# Patient Record
Sex: Male | Born: 1965 | Race: White | Hispanic: No | Smoking: Heavy tobacco smoker
Health system: Southern US, Community
[De-identification: ages and names within clinical notes are randomized; demographics above are authoritative.]

## PROBLEM LIST (undated history)

## (undated) DIAGNOSIS — F319 Bipolar disorder, unspecified: Secondary | ICD-10-CM

## (undated) DIAGNOSIS — F32A Depression, unspecified: Secondary | ICD-10-CM

## (undated) DIAGNOSIS — F329 Major depressive disorder, single episode, unspecified: Secondary | ICD-10-CM

## (undated) DIAGNOSIS — I10 Essential (primary) hypertension: Secondary | ICD-10-CM

## (undated) DIAGNOSIS — I639 Cerebral infarction, unspecified: Secondary | ICD-10-CM

## (undated) DIAGNOSIS — F419 Anxiety disorder, unspecified: Secondary | ICD-10-CM

## (undated) HISTORY — PX: WISDOM TOOTH EXTRACTION: SHX21

## (undated) HISTORY — PX: LOOP RECORDER INSERTION: EP1214

---
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
## 2019-02-20 ENCOUNTER — Inpatient Hospital Stay (HOSPITAL_COMMUNITY): Payer: BC Managed Care – PPO

## 2019-02-20 ENCOUNTER — Emergency Department (HOSPITAL_COMMUNITY): Payer: BC Managed Care – PPO

## 2019-02-20 ENCOUNTER — Inpatient Hospital Stay (HOSPITAL_COMMUNITY)
Admission: EM | Admit: 2019-02-20 | Discharge: 2019-02-22 | DRG: 061 | Disposition: A | Discharge status: Home / Self Care | Payer: BC Managed Care – PPO | Attending: Neurology | Admitting: Neurology

## 2019-02-20 ENCOUNTER — Other Ambulatory Visit: Payer: Self-pay

## 2019-02-20 ENCOUNTER — Encounter (HOSPITAL_COMMUNITY): Payer: Self-pay | Admitting: Radiology

## 2019-02-20 DIAGNOSIS — I63519 Cerebral infarction due to unspecified occlusion or stenosis of unspecified middle cerebral artery: Secondary | ICD-10-CM | POA: Diagnosis present

## 2019-02-20 DIAGNOSIS — I160 Hypertensive urgency: Secondary | ICD-10-CM | POA: Diagnosis present

## 2019-02-20 DIAGNOSIS — R531 Weakness: Secondary | ICD-10-CM | POA: Diagnosis present

## 2019-02-20 DIAGNOSIS — R29704 NIHSS score 4: Secondary | ICD-10-CM | POA: Diagnosis present

## 2019-02-20 DIAGNOSIS — E785 Hyperlipidemia, unspecified: Secondary | ICD-10-CM | POA: Diagnosis present

## 2019-02-20 DIAGNOSIS — F1020 Alcohol dependence, uncomplicated: Secondary | ICD-10-CM | POA: Diagnosis present

## 2019-02-20 DIAGNOSIS — Z7289 Other problems related to lifestyle: Secondary | ICD-10-CM

## 2019-02-20 DIAGNOSIS — Q279 Congenital malformation of peripheral vascular system, unspecified: Secondary | ICD-10-CM

## 2019-02-20 DIAGNOSIS — G8190 Hemiplegia, unspecified affecting unspecified side: Secondary | ICD-10-CM | POA: Diagnosis present

## 2019-02-20 DIAGNOSIS — E663 Overweight: Secondary | ICD-10-CM | POA: Diagnosis present

## 2019-02-20 DIAGNOSIS — F17210 Nicotine dependence, cigarettes, uncomplicated: Secondary | ICD-10-CM | POA: Diagnosis present

## 2019-02-20 DIAGNOSIS — I639 Cerebral infarction, unspecified: Secondary | ICD-10-CM | POA: Diagnosis not present

## 2019-02-20 DIAGNOSIS — F1010 Alcohol abuse, uncomplicated: Secondary | ICD-10-CM | POA: Diagnosis present

## 2019-02-20 DIAGNOSIS — I6529 Occlusion and stenosis of unspecified carotid artery: Secondary | ICD-10-CM | POA: Diagnosis present

## 2019-02-20 DIAGNOSIS — I671 Cerebral aneurysm, nonruptured: Secondary | ICD-10-CM | POA: Diagnosis not present

## 2019-02-20 DIAGNOSIS — Q282 Arteriovenous malformation of cerebral vessels: Secondary | ICD-10-CM

## 2019-02-20 DIAGNOSIS — Z20822 Contact with and (suspected) exposure to covid-19: Secondary | ICD-10-CM | POA: Diagnosis present

## 2019-02-20 DIAGNOSIS — I6389 Other cerebral infarction: Secondary | ICD-10-CM | POA: Diagnosis not present

## 2019-02-20 HISTORY — DX: Essential (primary) hypertension: I10

## 2019-02-20 LAB — DIFFERENTIAL
Abs Immature Granulocytes: 0.06 10*3/uL (ref 0.00–0.07)
Basophils Absolute: 0.1 10*3/uL (ref 0.0–0.1)
Basophils Relative: 1 %
Eosinophils Absolute: 0.7 10*3/uL — ABNORMAL HIGH (ref 0.0–0.5)
Eosinophils Relative: 5 %
Immature Granulocytes: 1 %
Lymphocytes Relative: 25 %
Lymphs Abs: 3.1 10*3/uL (ref 0.7–4.0)
Monocytes Absolute: 1 10*3/uL (ref 0.1–1.0)
Monocytes Relative: 8 %
Neutro Abs: 7.7 10*3/uL (ref 1.7–7.7)
Neutrophils Relative %: 60 %

## 2019-02-20 LAB — COMPREHENSIVE METABOLIC PANEL
ALT: 40 U/L (ref 0–44)
AST: 28 U/L (ref 15–41)
Albumin: 3.6 g/dL (ref 3.5–5.0)
Alkaline Phosphatase: 99 U/L (ref 38–126)
Anion gap: 9 (ref 5–15)
BUN: 9 mg/dL (ref 6–20)
CO2: 22 mmol/L (ref 22–32)
Calcium: 8.8 mg/dL — ABNORMAL LOW (ref 8.9–10.3)
Chloride: 103 mmol/L (ref 98–111)
Creatinine, Ser: 1.03 mg/dL (ref 0.61–1.24)
GFR calc Af Amer: >60 mL/min (ref >60)
GFR calc non Af Amer: >60 mL/min (ref >60)
Glucose, Bld: 88 mg/dL (ref 70–99)
Potassium: 4.2 mmol/L (ref 3.5–5.1)
Sodium: 134 mmol/L — ABNORMAL LOW (ref 135–145)
Total Bilirubin: 0.9 mg/dL (ref 0.3–1.2)
Total Protein: 6.8 g/dL (ref 6.5–8.1)

## 2019-02-20 LAB — I-STAT CHEM 8, ED
BUN: 11 mg/dL (ref 6–20)
Calcium, Ion: 1 mmol/L — ABNORMAL LOW (ref 1.15–1.40)
Chloride: 101 mmol/L (ref 98–111)
Creatinine, Ser: 1.1 mg/dL (ref 0.61–1.24)
Glucose, Bld: 85 mg/dL (ref 70–99)
HCT: 50 % (ref 39.0–52.0)
Hemoglobin: 17 g/dL (ref 13.0–17.0)
Potassium: 4.1 mmol/L (ref 3.5–5.1)
Sodium: 134 mmol/L — ABNORMAL LOW (ref 135–145)
TCO2: 24 mmol/L (ref 22–32)

## 2019-02-20 LAB — CBC
HCT: 50.2 % (ref 39.0–52.0)
Hemoglobin: 16.6 g/dL (ref 13.0–17.0)
MCH: 31.3 pg (ref 26.0–34.0)
MCHC: 33.1 g/dL (ref 30.0–36.0)
MCV: 94.7 fL (ref 80.0–100.0)
Platelets: 282 10*3/uL (ref 150–400)
RBC: 5.3 MIL/uL (ref 4.22–5.81)
RDW: 12.1 % (ref 11.5–15.5)
WBC: 12.5 10*3/uL — ABNORMAL HIGH (ref 4.0–10.5)
nRBC: 0 % (ref 0.0–0.2)

## 2019-02-20 LAB — MRSA PCR SCREENING: MRSA by PCR: NEGATIVE

## 2019-02-20 LAB — PROTIME-INR
INR: 0.9 (ref 0.8–1.2)
Prothrombin Time: 12.5 seconds (ref 11.4–15.2)

## 2019-02-20 LAB — CBG MONITORING, ED: Glucose-Capillary: 82 mg/dL (ref 70–99)

## 2019-02-20 LAB — APTT: aPTT: 29 seconds (ref 24–36)

## 2019-02-20 LAB — HIV ANTIBODY (ROUTINE TESTING W REFLEX): HIV Screen 4th Generation wRfx: NONREACTIVE

## 2019-02-20 LAB — ETHANOL: Alcohol, Ethyl (B): 40 mg/dL — ABNORMAL HIGH (ref <10)

## 2019-02-20 LAB — SARS CORONAVIRUS 2 (TAT 6-24 HRS): SARS Coronavirus 2: NEGATIVE

## 2019-02-20 MED ORDER — SODIUM CHLORIDE 0.9 % IV SOLN: INTRAVENOUS | Status: DC

## 2019-02-20 MED ORDER — ACETAMINOPHEN 325 MG PO TABS: 650.0000 mg | ORAL_TABLET | ORAL | Status: DC | PRN

## 2019-02-20 MED ORDER — ACETAMINOPHEN 650 MG RE SUPP: 650.0000 mg | RECTAL | Status: DC | PRN

## 2019-02-20 MED ORDER — STROKE: EARLY STAGES OF RECOVERY BOOK
Freq: Once | Status: DC
  Filled 2019-02-20: qty 1

## 2019-02-20 MED ORDER — PANTOPRAZOLE SODIUM 40 MG IV SOLR
40.0000 mg | Freq: Every day | INTRAVENOUS | Status: DC
  Administered 2019-02-21: 21:00:00 40 mg via INTRAVENOUS
  Filled 2019-02-20: qty 40

## 2019-02-20 MED ORDER — LABETALOL HCL 5 MG/ML IV SOLN: 20.0000 mg | Freq: Once | INTRAVENOUS | Status: DC

## 2019-02-20 MED ORDER — ACETAMINOPHEN 160 MG/5ML PO SOLN: 650.0000 mg | ORAL | Status: DC | PRN

## 2019-02-20 MED ORDER — SENNOSIDES-DOCUSATE SODIUM 8.6-50 MG PO TABS: 1.0000 | ORAL_TABLET | Freq: Every evening | ORAL | Status: DC | PRN

## 2019-02-20 MED ORDER — CLEVIDIPINE BUTYRATE 0.5 MG/ML IV EMUL: 0.0000 mg/h | INTRAVENOUS | Status: DC

## 2019-02-20 MED ORDER — ALTEPLASE (STROKE) FULL DOSE INFUSION
90.0000 mg | Freq: Once | INTRAVENOUS | Status: AC
  Administered 2019-02-20: 90 mg via INTRAVENOUS
  Filled 2019-02-20: qty 100

## 2019-02-20 MED ORDER — IOHEXOL 350 MG/ML SOLN
100.0000 mL | Freq: Once | INTRAVENOUS | Status: AC | PRN
  Administered 2019-02-20: 100 mL via INTRAVENOUS

## 2019-02-20 MED ORDER — SODIUM CHLORIDE 0.9% FLUSH
3.0000 mL | Freq: Once | INTRAVENOUS | Status: AC
  Administered 2019-02-20: 3 mL via INTRAVENOUS

## 2019-02-20 MED ORDER — SODIUM CHLORIDE 0.9 % IV SOLN
50.0000 mL | Freq: Once | INTRAVENOUS | Status: AC
  Administered 2019-02-20: 50 mL via INTRAVENOUS

## 2019-02-20 MED ORDER — CHLORHEXIDINE GLUCONATE CLOTH 2 % EX PADS
6.0000 | MEDICATED_PAD | Freq: Every day | CUTANEOUS | Status: DC
  Administered 2019-02-20: 6 via TOPICAL

## 2019-02-20 NOTE — Progress Notes (Signed)
PHARMACIST CODE STROKE RESPONSE  Notified to mix tPA at 1609 by Dr. Wilford Corner Delivered tPA to RN at 1612  tPA dose = 9mg  bolus over 1 minute followed by 81mg  for a total dose of 90mg  over 1 hour  Issues/delays encountered (if applicable):   PharmD. BCPS 02/20/19 4:18 PM

## 2019-02-20 NOTE — ED Notes (Signed)
[id number] Stephen Terry WIFE PLEASE UPDATE HER

## 2019-02-20 NOTE — H&P (Signed)
STROKE H&P  CC: left sided weakness,   History is obtained from:  HPI: Stephen Terry is a 54 y.o. male no significant past medical history brought in for sudden onset of left-sided facial droop and slurred speech. According the patient he was sitting watching TV, drinking beer.  He drinks about 12 beers a day and he had only had 4 beers today.  He noted sudden onset of left-sided weakness, left facial droop and his speech sounded much slurred than what he would expect for after having had 4 beers. EMS was called, they also noted left-sided facial droop that was predominant, some left drift and brought him as code stroke.  LKW: 1445 today tpa given?:  Yes Premorbid modified Rankin scale (mRS): 0  ROS: ROS was performed and is negative except as noted in the HPI\  History reviewed. No pertinent past medical history. No past history  No family history on file. No family history  Social History:   has no history on file for tobacco, alcohol, and drug. Denies Medications  Current Facility-Administered Medications:  .   stroke: mapping our early stages of recovery book, , Does not apply, Once, Milon Dikes, MD .  0.9 %  sodium chloride infusion, , Intravenous, Continuous, Milon Dikes, MD .  acetaminophen (TYLENOL) tablet 650 mg, 650 mg, Oral, Q4H PRN **OR** acetaminophen (TYLENOL) 160 MG/5ML solution 650 mg, 650 mg, Per Tube, Q4H PRN **OR** acetaminophen (TYLENOL) suppository 650 mg, 650 mg, Rectal, Q4H PRN, Milon Dikes, MD .  labetalol (NORMODYNE) injection 20 mg, 20 mg, Intravenous, Once **AND** clevidipine (CLEVIPREX) infusion 0.5 mg/mL, 0-21 mg/hr, Intravenous, Continuous, Milon Dikes, MD .  iohexol (OMNIPAQUE) 350 MG/ML injection 100 mL, 100 mL, Intravenous, Once PRN, Milon Dikes, MD .  pantoprazole (PROTONIX) injection 40 mg, 40 mg, Intravenous, QHS, Milon Dikes, MD .  senna-docusate (Senokot-S) tablet 1 tablet, 1 tablet, Oral, QHS PRN, Milon Dikes, MD .  sodium chloride  flush (NS) 0.9 % injection 3 mL, 3 mL, Intravenous, Once, Melene Plan, DO No current outpatient medications on file.  Exam: Current vital signs: BP (!) 145/88 (BP Location: Right Arm)   Pulse 68   Temp 98.4 F (36.9 C) (Oral)   Resp 12   Ht 6\' 3"  (1.905 m)   Wt 107 kg   SpO2 100%   BMI 29.48 kg/m  Vital signs in last 24 hours: Temp:  [98.4 F (36.9 C)] 98.4 F (36.9 C) (01/31 1655) Pulse Rate:  [68] 68 (01/31 1655) Resp:  [12] 12 (01/31 1655) BP: (145)/(88) 145/88 (01/31 1655) SpO2:  [100 %] 100 % (01/31 1655) Weight:  [107 kg-107.2 kg] 107 kg (01/31 1656) General: Awake alert in no distress HEENT: Cephalic atraumatic Lungs: Clear to auscultation Cardiovascular: Regular rate rhythm Abdomen: Soft nondistended nontender Extremities warm well perfused Neurological exam Awake alert oriented x3 Speech is dysarthric There is no evidence of aphasia Follows all commands Cranial: Pupils equal round react light, extraocular movements intact, visual fields are full, left lower facial weakness seen, tongue and palate midline. Motor exam mild left and left upper and lower extremity.  Right side full-strength Sensory exam: No loss Coordination: Mild dysmetria on the left not disproportionate to weakness but has difficulty performing rapid alternating movements in fine motor tasks from the left upper extremity. Gait testing was deferred NIH stroke scale 1a Level of Conscious.: 0 1b LOC Questions: 0 1c LOC Commands: 0 2 Best Gaze: 0 3 Visual: 0 4 Facial Palsy: 1 5a Motor Arm - left:  1 5b Motor Arm - Right: 0 6a Motor Leg - Left: 1 6b Motor Leg - Right: 0 7 Limb Ataxia: 0 8 Sensory: 0 9 Best Language: 0 10 Dysarthria: 1 11 Extinct. and Inatten.: 0 TOTAL: 4   Labs I have reviewed labs in epic and the results pertinent to this consultation are:  CBC    Component Value Date/Time   WBC 12.5 (H) 02/20/2019 1612   RBC 5.30 02/20/2019 1612   HGB 16.6 02/20/2019 1612   HGB  17.0 02/20/2019 1612   HCT 50.2 02/20/2019 1612   HCT 50.0 02/20/2019 1612   PLT 282 02/20/2019 1612   MCV 94.7 02/20/2019 1612   MCH 31.3 02/20/2019 1612   MCHC 33.1 02/20/2019 1612   RDW 12.1 02/20/2019 1612   LYMPHSABS 3.1 02/20/2019 1612   MONOABS 1.0 02/20/2019 1612   EOSABS 0.7 (H) 02/20/2019 1612   BASOSABS 0.1 02/20/2019 1612    CMP     Component Value Date/Time   NA 134 (L) 02/20/2019 1612   NA 134 (L) 02/20/2019 1612   K 4.2 02/20/2019 1612   K 4.1 02/20/2019 1612   CL 103 02/20/2019 1612   CL 101 02/20/2019 1612   CO2 22 02/20/2019 1612   GLUCOSE 88 02/20/2019 1612   GLUCOSE 85 02/20/2019 1612   BUN 9 02/20/2019 1612   BUN 11 02/20/2019 1612   CREATININE 1.03 02/20/2019 1612   CREATININE 1.10 02/20/2019 1612   CALCIUM 8.8 (L) 02/20/2019 1612   PROT 6.8 02/20/2019 1612   ALBUMIN 3.6 02/20/2019 1612   AST 28 02/20/2019 1612   ALT 40 02/20/2019 1612   ALKPHOS 99 02/20/2019 1612   BILITOT 0.9 02/20/2019 1612   GFRNONAA >60 02/20/2019 1612   GFRAA >60 02/20/2019 1612   Imaging I have reviewed the images obtained:  CT-scan of the brain-no acute changes. No bleed noted on my review Radiology read concerning for possible hyperdensity in the right lateral tentorium possibly a small meningioma but cannot rule out acute bleed. CT perfusion-negative CT angio head and neck-no emergent LVO There is an abnormal venous vascularity in the posterior right hemisphere which accounts for the increased right lateral density on the plain CT.  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  Neuro endovascular recommendation for follow-up.  Assessment: 54 year old man with history of alcoholism coming in with sudden onset of left-sided facial droop, dysarthria and left arm and leg weakness. Contraindications for TPA reviewed. I personally reviewed CT of the head and did not appreciate a frank bleed.  I  offered him TPA and discussed risk benefits with after discussing risks and benefits with him, he agreed for IV TPA which I started. After starting the IV TPA and him getting the bolus, the radiologist called with the CT finding of concern-possible meningioma versus a bleed in the right tentorial area. I stopped the TPA, till further imaging in the form of CTA head and neck could be obtained. The CT head and neck revealed a possibly vascular malformation in that area and not a frank bleed.  tPA was restarted.  Impression: Acute ischemic stroke-likely small vessel etiology EtOH abuse-could be a confounder. 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  Needs endovascular consult Hypertensive urgency-did not require medications to bring down to levels for TPA administration.  Recommendations: Admit to ICU for post TPA care Post TPA vitals and neurochecks Systolic blood pressure goal less than 180-use  labetalol as needed and Cleviprex drip if needed. Frequent neurochecks No antiplatelets or anticoagulants MRI brain in 24 hours Image sooner if neurological changes observed 2D echo A1c Lipid panel PT OT Speech therapy IV fluids-normal saline 75 cc an hour Check morning labs Replete electrolytes as necessary Check urine toxicology and blood alcohol. CIWA protocol for alcohol abuse Endovascular consult for vascular malformation-for possible elective diagnostic cerebral angiogram.  -- Milon Dikes, MD [facility] Pager: [phone] If 7pm to 7am, please call on call as listed on AMION.  Present on arrival: Acute ischemic stroke, hemiparesis, hypertensive urgency.  CRITICAL CARE ATTESTATION Performed by: Milon Dikes, MD Total critical care time:40  minutes Critical care time was exclusive of separately billable procedures and treating other patients and/or supervising APPs/Residents/Students Critical care was necessary to treat or prevent imminent or  life-threatening deterioration due to acute ischemic stroke This patient is critically ill and at significant risk for neurological worsening and/or death and care requires constant monitoring. Critical care was time spent personally by me on the following activities: development of treatment plan with patient and/or surrogate as well as nursing, discussions with consultants, evaluation of patient's response to treatment, examination of patient, obtaining history from patient or surrogate, ordering and performing treatments and interventions, ordering and review of laboratory studies, ordering and review of radiographic studies, pulse oximetry, re-evaluation of patient's condition, participation in multidisciplinary rounds and medical decision making of high complexity in the care of this patient.

## 2019-02-20 NOTE — ED Provider Notes (Signed)
[place] EMERGENCY DEPARTMENT Provider Note   CSN: [id number] Arrival date & time: 02/20/19  1600  An emergency department physician performed an initial assessment on this suspected stroke patient at 1604.  History Chief Complaint  Patient presents with  . Code Stroke    Isahi Godwin is a 54 y.o. male.  54 year old male with a chief complaints of slurred speech and left-sided weakness.  Arrived as a code stroke.  Airway was protected at the bridge.  Taken urgently back to CAT scan.  Level 5 caveat acuity of condition.  The history is provided by the patient.  Illness Severity:  Severe Onset quality:  Sudden Duration:  1 hour Timing:  Constant Progression:  Unchanged Chronicity:  New Associated symptoms: no abdominal pain, no chest pain, no congestion, no diarrhea, no fever, no headaches, no myalgias, no rash, no shortness of breath and no vomiting        Past Medical History:  Diagnosis Date  . Hypertension     Patient Active Problem List   Diagnosis Date Noted  . Acute ischemic stroke ([place]) 02/20/2019    History reviewed. No pertinent surgical history.     No family history on file.  Social History   Tobacco Use  . Smoking status: Heavy Tobacco Smoker    Packs/day: 1.00    Types: Cigarettes  . Smokeless tobacco: Never Used  Substance Use Topics  . Alcohol use: Yes    Alcohol/week: 10.0 - 12.0 standard drinks    Types: 10 - 12 Cans of beer per week  . Drug use: Not on file    Home Medications Prior to Admission medications   Medication Sig Start Date End Date Taking? Authorizing Provider  lamoTRIgine (LAMICTAL) 200 MG tablet Take 200 mg by mouth daily.   Yes [provider]  propranolol (INDERAL) 20 MG tablet Take 20-40 mg by mouth 3 (three) times daily as needed (For tremers).   Yes [provider]  sertraline (ZOLOFT) 100 MG tablet Take 150 mg by mouth daily.   Yes [provider]    Allergies      Patient has no known allergies.  Review of Systems   Review of Systems  Constitutional: Negative for chills and fever.  HENT: Negative for congestion and facial swelling.   Eyes: Negative for discharge and visual disturbance.  Respiratory: Negative for shortness of breath.   Cardiovascular: Negative for chest pain and palpitations.  Gastrointestinal: Negative for abdominal pain, diarrhea and vomiting.  Musculoskeletal: Negative for arthralgias and myalgias.  Skin: Negative for color change and rash.  Neurological: Positive for speech difficulty and weakness. Negative for tremors, syncope and headaches.  Psychiatric/Behavioral: Negative for confusion and dysphoric mood.    Physical Exam Updated Vital Signs BP 134/79   Pulse 60   Temp 98.4 F (36.9 C) (Oral)   Resp (!) 21   Ht 6\' 3"  (1.905 m)   Wt 107 kg   SpO2 100%   BMI 29.48 kg/m   Physical Exam Vitals and nursing note reviewed.  Constitutional:      Appearance: He is well-developed.  HENT:     Head: Normocephalic and atraumatic.  Eyes:     Pupils: Pupils are equal, round, and reactive to light.  Neck:     Vascular: No JVD.  Cardiovascular:     Rate and Rhythm: Normal rate and regular rhythm.     Heart sounds: No murmur. No friction rub. No gallop.   Pulmonary:  Effort: No respiratory distress.     Breath sounds: No wheezing.  Abdominal:     General: There is no distension.     Tenderness: There is no guarding or rebound.  Musculoskeletal:        General: Normal range of motion.     Cervical back: Normal range of motion and neck supple.  Skin:    Coloration: Skin is not pale.     Findings: No rash.  Neurological:     Mental Status: He is alert and oriented to person, place, and time.     Comments: Left-sided facial droop left-sided weakness.  Psychiatric:        Behavior: Behavior normal.     ED Results / Procedures / Treatments   Labs (all labs ordered are listed, but only abnormal results are  displayed) Labs Reviewed  CBC - Abnormal; Notable for the following components:      Result Value   WBC 12.5 (*)    All other components within normal limits  DIFFERENTIAL - Abnormal; Notable for the following components:   Eosinophils Absolute 0.7 (*)    All other components within normal limits  COMPREHENSIVE METABOLIC PANEL - Abnormal; Notable for the following components:   Sodium 134 (*)    Calcium 8.8 (*)    All other components within normal limits  I-STAT CHEM 8, ED - Abnormal; Notable for the following components:   Sodium 134 (*)    Calcium, Ion 1.00 (*)    All other components within normal limits  SARS CORONAVIRUS 2 (TAT 6-24 HRS)  PROTIME-INR  APTT  HIV ANTIBODY (ROUTINE TESTING W REFLEX)  HEMOGLOBIN A1C  LIPID PANEL  ETHANOL  CBG MONITORING, ED    EKG None  Radiology CT ANGIO HEAD W OR WO CONTRAST  Result Date: 02/20/2019 CLINICAL DATA:  54 year old male code stroke presentation with left side weakness. IV tPA given. 8 mm nodular hyperdensity along the right tentorium on plain CT, favored to be small meningioma. EXAM: CT ANGIOGRAPHY HEAD AND NECK CT PERFUSION BRAIN TECHNIQUE: Multidetector CT imaging of the head and neck was performed using the standard protocol during bolus administration of intravenous contrast. Multiplanar CT image reconstructions and MIPs were obtained to evaluate the vascular anatomy. Carotid stenosis measurements (when applicable) are obtained utilizing NASCET criteria, using the distal internal carotid diameter as the denominator. Multiphase CT imaging of the brain was performed following IV bolus contrast injection. Subsequent parametric perfusion maps were calculated using RAPID software. CONTRAST:  100 milliliters Omnipaque 350 COMPARISON:  Head CT without contrast 1609 hours today. FINDINGS: CT Brain Perfusion Findings: ASPECTS: 10 CBF (<30%) Volume: None Perfusion (Tmax>6.0s) volume: None Mismatch Volume: Not applicable Infarction  Location:Not applicable CTA NECK Skeleton: Chronic sinusitis. No acute osseous abnormality identified. Upper chest: Negative. No superior mediastinal lymphadenopathy. Other neck: Negative neck soft tissues. Aortic arch: 3 vessel arch configuration with no arch atherosclerosis. Right carotid system: No brachiocephalic or right CCA origin plaque or stenosis. Negative right carotid bifurcation. Negative cervical right ICA. Left carotid system: Negative. Vertebral arteries: Both proximal subclavian arteries and cervical vertebral arteries are negative. The left vertebral is mildly dominant. CTA HEAD Posterior circulation: Mildly dominant left vertebral artery, the right V4 segment decreases in size beyond the PICA. Normal PICA origins. No distal vertebral or vertebrobasilar junction stenosis. Patent basilar artery without stenosis. Patent SCA and PCA origins. Posterior communicating arteries are diminutive or absent. Bilateral PCA branches are within normal limits. Anterior circulation: Both ICA siphons are patent. Mild  siphon calcified plaque which appears greater on the left, without hemodynamically significant stenosis. Patent carotid termini, MCA and ACA origins. Anterior communicating artery and bilateral ACA branches are within normal limits. Left MCA M1 and trifurcation are patent without stenosis. Left MCA branches are within normal limits. Right MCA M1 and bifurcation are patent without stenosis. Right MCA branches appear within normal limits. Venous sinuses: Patent. There is abnormal venous vascularity tracking from the right vein of Galen along the straight sinus and to the right tentorium and sigmoid sinus as seen on series 5, images 55 and 57. Arterial supply to this vascular malformation is unclear, perhaps the right PCA, although that vessel is minimally enlarged if at all compared to the left PCA. However, there is mild asymmetry of the right MCA branches with respect to the right vein of lobe a (series  8 image 15), although it is not clear that this finding communicates with the other venous abnormality. Anatomic variants: None. Other findings: Delayed postcontrast images provided for further evaluation of the right tentorial abnormality. These redemonstrate abnormal vascularity in the a posterior medial right parieto-occipital sulcus and occiput in association with the abnormal venous vascularity described earlier. No other abnormal intracranial enhancement identified. Review of the MIP images confirms the above findings IMPRESSION: 1. Negative for large vessel occlusion and no infarct or ischemia detected by CTP. 2. Positive for abnormal venous vascularity in the posterior right hemisphere which accounts for the increased right tentorial density on the plain CT. 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 Neuro-endovascular follow-up is recommended. 3. No atherosclerosis or stenosis in the neck. Mild ICA siphon calcified plaque without stenosis. 4. 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 Preliminary sent via AMION to Dr. Milon Dikes on 02/20/2019 at 1636 hours. Final report communicated to Dr. Wilford Corner at 1655 hours by text page via the [facility] messaging system. Electronically Signed   By: Odessa Fleming M.D.   On: 02/20/2019 16:59   CT ANGIO NECK W OR WO CONTRAST  Result Date: 02/20/2019 CLINICAL DATA:  54 year old male code stroke presentation with left side weakness. IV tPA given. 8 mm nodular hyperdensity along the right tentorium on plain CT, favored to be small meningioma. EXAM: CT ANGIOGRAPHY HEAD AND NECK CT PERFUSION BRAIN TECHNIQUE: Multidetector CT imaging of the head and neck was performed using the standard protocol during bolus administration of intravenous contrast. Multiplanar CT image reconstructions and MIPs were obtained to evaluate the vascular anatomy. Carotid stenosis measurements (when applicable) are  obtained utilizing NASCET criteria, using the distal internal carotid diameter as the denominator. Multiphase CT imaging of the brain was performed following IV bolus contrast injection. Subsequent parametric perfusion maps were calculated using RAPID software. CONTRAST:  100 milliliters Omnipaque 350 COMPARISON:  Head CT without contrast 1609 hours today. FINDINGS: CT Brain Perfusion Findings: ASPECTS: 10 CBF (<30%) Volume: None Perfusion (Tmax>6.0s) volume: None Mismatch Volume: Not applicable Infarction Location:Not applicable CTA NECK Skeleton: Chronic sinusitis. No acute osseous abnormality identified. Upper chest: Negative. No superior mediastinal lymphadenopathy. Other neck: Negative neck soft tissues. Aortic arch: 3 vessel arch configuration with no arch atherosclerosis. Right carotid system: No brachiocephalic or right CCA origin plaque or stenosis. Negative right carotid bifurcation. Negative cervical right ICA. Left carotid system: Negative. Vertebral arteries: Both proximal subclavian arteries and cervical vertebral arteries are negative. The left vertebral is mildly dominant. CTA HEAD Posterior circulation: Mildly dominant left vertebral artery, the right V4 segment decreases  in size beyond the PICA. Normal PICA origins. No distal vertebral or vertebrobasilar junction stenosis. Patent basilar artery without stenosis. Patent SCA and PCA origins. Posterior communicating arteries are diminutive or absent. Bilateral PCA branches are within normal limits. Anterior circulation: Both ICA siphons are patent. Mild siphon calcified plaque which appears greater on the left, without hemodynamically significant stenosis. Patent carotid termini, MCA and ACA origins. Anterior communicating artery and bilateral ACA branches are within normal limits. Left MCA M1 and trifurcation are patent without stenosis. Left MCA branches are within normal limits. Right MCA M1 and bifurcation are patent without stenosis. Right MCA  branches appear within normal limits. Venous sinuses: Patent. There is abnormal venous vascularity tracking from the right vein of Galen along the straight sinus and to the right tentorium and sigmoid sinus as seen on series 5, images 55 and 57. Arterial supply to this vascular malformation is unclear, perhaps the right PCA, although that vessel is minimally enlarged if at all compared to the left PCA. However, there is mild asymmetry of the right MCA branches with respect to the right vein of lobe a (series 8 image 15), although it is not clear that this finding communicates with the other venous abnormality. Anatomic variants: None. Other findings: Delayed postcontrast images provided for further evaluation of the right tentorial abnormality. These redemonstrate abnormal vascularity in the a posterior medial right parieto-occipital sulcus and occiput in association with the abnormal venous vascularity described earlier. No other abnormal intracranial enhancement identified. Review of the MIP images confirms the above findings IMPRESSION: 1. Negative for large vessel occlusion and no infarct or ischemia detected by CTP. 2. Positive for abnormal venous vascularity in the posterior right hemisphere which accounts for the increased right tentorial density on the plain CT. 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 Neuro-endovascular follow-up is recommended. 3. No atherosclerosis or stenosis in the neck. Mild ICA siphon calcified plaque without stenosis. 4. 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 Preliminary sent via AMION to Dr. Milon Dikes on 02/20/2019 at 1636 hours. Final report communicated to Dr. Wilford Corner at 1655 hours by text page via the [facility] messaging system. Electronically Signed   By: Odessa Fleming M.D.   On: 02/20/2019 16:59   CT CEREBRAL PERFUSION W CONTRAST  Result Date: 02/20/2019 CLINICAL DATA:  54 year old male  code stroke presentation with left side weakness. IV tPA given. 8 mm nodular hyperdensity along the right tentorium on plain CT, favored to be small meningioma. EXAM: CT ANGIOGRAPHY HEAD AND NECK CT PERFUSION BRAIN TECHNIQUE: Multidetector CT imaging of the head and neck was performed using the standard protocol during bolus administration of intravenous contrast. Multiplanar CT image reconstructions and MIPs were obtained to evaluate the vascular anatomy. Carotid stenosis measurements (when applicable) are obtained utilizing NASCET criteria, using the distal internal carotid diameter as the denominator. Multiphase CT imaging of the brain was performed following IV bolus contrast injection. Subsequent parametric perfusion maps were calculated using RAPID software. CONTRAST:  100 milliliters Omnipaque 350 COMPARISON:  Head CT without contrast 1609 hours today. FINDINGS: CT Brain Perfusion Findings: ASPECTS: 10 CBF (<30%) Volume: None Perfusion (Tmax>6.0s) volume: None Mismatch Volume: Not applicable Infarction Location:Not applicable CTA NECK Skeleton: Chronic sinusitis. No acute osseous abnormality identified. Upper chest: Negative. No superior mediastinal lymphadenopathy. Other neck: Negative neck soft tissues. Aortic arch: 3 vessel arch configuration with no arch atherosclerosis. Right carotid system: No brachiocephalic or right CCA origin plaque or  stenosis. Negative right carotid bifurcation. Negative cervical right ICA. Left carotid system: Negative. Vertebral arteries: Both proximal subclavian arteries and cervical vertebral arteries are negative. The left vertebral is mildly dominant. CTA HEAD Posterior circulation: Mildly dominant left vertebral artery, the right V4 segment decreases in size beyond the PICA. Normal PICA origins. No distal vertebral or vertebrobasilar junction stenosis. Patent basilar artery without stenosis. Patent SCA and PCA origins. Posterior communicating arteries are diminutive or  absent. Bilateral PCA branches are within normal limits. Anterior circulation: Both ICA siphons are patent. Mild siphon calcified plaque which appears greater on the left, without hemodynamically significant stenosis. Patent carotid termini, MCA and ACA origins. Anterior communicating artery and bilateral ACA branches are within normal limits. Left MCA M1 and trifurcation are patent without stenosis. Left MCA branches are within normal limits. Right MCA M1 and bifurcation are patent without stenosis. Right MCA branches appear within normal limits. Venous sinuses: Patent. There is abnormal venous vascularity tracking from the right vein of Galen along the straight sinus and to the right tentorium and sigmoid sinus as seen on series 5, images 55 and 57. Arterial supply to this vascular malformation is unclear, perhaps the right PCA, although that vessel is minimally enlarged if at all compared to the left PCA. However, there is mild asymmetry of the right MCA branches with respect to the right vein of lobe a (series 8 image 15), although it is not clear that this finding communicates with the other venous abnormality. Anatomic variants: None. Other findings: Delayed postcontrast images provided for further evaluation of the right tentorial abnormality. These redemonstrate abnormal vascularity in the a posterior medial right parieto-occipital sulcus and occiput in association with the abnormal venous vascularity described earlier. No other abnormal intracranial enhancement identified. Review of the MIP images confirms the above findings IMPRESSION: 1. Negative for large vessel occlusion and no infarct or ischemia detected by CTP. 2. Positive for abnormal venous vascularity in the posterior right hemisphere which accounts for the increased right tentorial density on the plain CT. 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  Neuro-endovascular follow-up is recommended. 3. No atherosclerosis or stenosis in the neck. Mild ICA siphon calcified plaque without stenosis. 4. 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 Preliminary sent via AMION to Dr. Milon DikesASHISH ARORA on 02/20/2019 at 1636 hours. Final report communicated to Dr. Wilford CornerArora at 1655 hours by text page [facility]. Electronically Signed   By: Odessa FlemingH  Hall M.D.   On: 02/20/2019 16:59   CT HEAD CODE STROKE WO CONTRAST  Result Date: 02/20/2019 CLINICAL DATA:  Code stroke.  54 year old male left side weakness. EXAM: CT HEAD WITHOUT CONTRAST TECHNIQUE: Contiguous axial images were obtained from the base of the skull through the vertex without intravenous contrast. COMPARISON:  None. FINDINGS: Brain: No midline shift, mass effect, or evidence of intracranial mass lesion. No ventriculomegaly. There is a small 7-8 millimeter area of nodular hyperdensity along the junction of the right transverse and sigmoid sinuses on coronal image 52. This does not strongly resemble an acute intracranial hemorrhage, might be a small meningioma, uncertain. No other intracranial blood identified. Small round hypodensity at the right external capsule on series 3, image 15 is age indeterminate. There is mild right frontal lobe subcortical white matter hypodensity on series 3, image 20. No CT changes of acute cortically based infarct identified. No suspicious intracranial vascular hyperdensity. Vascular: Calcified atherosclerosis at the skull base. Skull: No  acute osseous abnormality identified. Sinuses/Orbits: Pansinus inflammatory changes with periosteal thickening and probable polyposis. Tympanic cavities and mastoids clear. Other: Visualized orbit soft tissues are within normal limits. Small midline forehead scalp lipoma. No acute scalp soft tissue finding. ASPECTS [facility] Stroke Program Early CT Score) Total score (0-10 with 10 being normal): 10 IMPRESSION: 1. Indeterminate 8 mm nodular  hyperdensity along the right lateral tentorium. But favor small meningioma rather than acute parenchymal hemorrhage. 2. There are small age indeterminate white matter changes in the right frontal lobe subcortical white matter. But no CT changes of acute cortically based infarct identified. Therefore ASPECTS 10. 3. 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 Salient findings were communicated to Dr. Wilford Corner at 4:16 pmon 02/20/2019 by text page via the [facility] messaging system. And also discussed by telephone with him at 16:18 . Electronically Signed   By: Odessa Fleming M.D.   On: 02/20/2019 16:19    Procedures Procedures (including critical care time) Procedure note: Ultrasound Guided Peripheral IV Ultrasound guided peripheral 1.88 inch angiocath IV placement performed by me. Indications: Nursing unable to place IV. Details: The antecubital fossa and upper arm were evaluated with a multifrequency linear probe. Patent brachial veins were noted. 1 attempt was made to cannulate a vein under realtime US guidance with successful cannulation of the vein and catheter placement. There is return of non-pulsatile dark red blood. The patient tolerated the procedure well without complications. Images archived electronically.  CPT codes: 16109 and [phone]  Medications Ordered in ED Medications  sodium chloride flush (NS) 0.9 % injection 3 mL (has no administration in time range)   stroke: mapping our early stages of recovery book (has no administration in time range)  0.9 %  sodium chloride infusion (has no administration in time range)  acetaminophen (TYLENOL) tablet 650 mg (has no administration in time range)    Or  acetaminophen (TYLENOL) 160 MG/5ML solution 650 mg (has no administration in time range)    Or  acetaminophen (TYLENOL) suppository 650 mg (has no administration in time range)  senna-docusate (Senokot-S) tablet 1 tablet (has no administration in time range)  pantoprazole (PROTONIX)  injection 40 mg (has no administration in time range)  labetalol (NORMODYNE) injection 20 mg (has no administration in time range)    And  clevidipine (CLEVIPREX) infusion 0.5 mg/mL (has no administration in time range)  iohexol (OMNIPAQUE) 350 MG/ML injection 100 mL (100 mLs Intravenous Contrast Given 02/20/19 1646)  alteplase (ACTIVASE) 1 mg/mL infusion 90 mg (0 mg Intravenous Stopped 02/20/19 1733)    Followed by  0.9 %  sodium chloride infusion (50 mLs Intravenous New Bag/Given 02/20/19 1747)    ED Course  I have reviewed the triage vital signs and the nursing notes.  Pertinent labs & imaging results that were available during my care of the patient were reviewed by me and considered in my medical decision making (see chart for details).    MDM Rules/Calculators/A&P                      54 yo M with a chief complaints of acute onset of stroke symptoms.  Arrived as a code stroke and taken urgently back to CT.  Given TPA bolus.  Then call from radiologist with concern for possible subdural.  TPA is paused.  Will obtain a angiogram of the head and neck.  CTA with unlikely subdural.  TPA continued.  Patient will go to the ICU.  Admit.  CRITICAL CARE Performed by: Reuel Boom  Franki MontePatrick Tylar Merendino   Total critical care time: 35 minutes  Critical care time was exclusive of separately billable procedures and treating other patients.  Critical care was necessary to treat or prevent imminent or life-threatening deterioration.  Critical care was time spent personally by me on the following activities: development of treatment plan with patient and/or surrogate as well as nursing, discussions with consultants, evaluation of patient's response to treatment, examination of patient, obtaining history from patient or surrogate, ordering and performing treatments and interventions, ordering and review of laboratory studies, ordering and review of radiographic studies, pulse oximetry and re-evaluation of  patient's condition.  The patients results and plan were reviewed and discussed.   Any x-rays performed were independently reviewed by myself.   Differential diagnosis were considered with the presenting HPI.  Medications  sodium chloride flush (NS) 0.9 % injection 3 mL (has no administration in time range)   stroke: mapping our early stages of recovery book (has no administration in time range)  0.9 %  sodium chloride infusion (has no administration in time range)  acetaminophen (TYLENOL) tablet 650 mg (has no administration in time range)    Or  acetaminophen (TYLENOL) 160 MG/5ML solution 650 mg (has no administration in time range)    Or  acetaminophen (TYLENOL) suppository 650 mg (has no administration in time range)  senna-docusate (Senokot-S) tablet 1 tablet (has no administration in time range)  pantoprazole (PROTONIX) injection 40 mg (has no administration in time range)  labetalol (NORMODYNE) injection 20 mg (has no administration in time range)    And  clevidipine (CLEVIPREX) infusion 0.5 mg/mL (has no administration in time range)  iohexol (OMNIPAQUE) 350 MG/ML injection 100 mL (100 mLs Intravenous Contrast Given 02/20/19 1646)  alteplase (ACTIVASE) 1 mg/mL infusion 90 mg (0 mg Intravenous Stopped 02/20/19 1733)    Followed by  0.9 %  sodium chloride infusion (50 mLs Intravenous New Bag/Given 02/20/19 1747)    Vitals:   02/20/19 1700 02/20/19 1715 02/20/19 1730 02/20/19 1745  BP: (!) 142/80 (!) 148/81 140/85 134/79  Pulse: (!) 59 65 68 60  Resp: 18 (!) 22 (!) 21 (!) 21  Temp:      TempSrc:      SpO2: 100% 100% 100% 100%  Weight:      Height:        Final diagnoses:  Acute ischemic stroke (HCC)    Admission/ observation were discussed with the admitting physician, patient and/or family and they are comfortable with the plan.   Final Clinical Impression(s) / ED Diagnoses Final diagnoses:  Acute ischemic stroke [facility](HCC)    Rx / DC Orders ED Discharge Orders     None       Melene PlanFloyd, Annalyssa Thune, DO 02/20/19 1754

## 2019-02-20 NOTE — ED Triage Notes (Signed)
Pt BIB [facility] from home. Pt complaint of left sided facial droop, lefts arm weakness. VSS. NAD.

## 2019-02-21 ENCOUNTER — Inpatient Hospital Stay (HOSPITAL_COMMUNITY): Payer: BC Managed Care – PPO

## 2019-02-21 DIAGNOSIS — I6389 Other cerebral infarction: Secondary | ICD-10-CM

## 2019-02-21 LAB — ECHOCARDIOGRAM COMPLETE
Height: 75 in
Weight: 3774.28 oz

## 2019-02-21 LAB — RAPID URINE DRUG SCREEN, HOSP PERFORMED
Amphetamines: NOT DETECTED
Barbiturates: NOT DETECTED
Benzodiazepines: NOT DETECTED
Cocaine: NOT DETECTED
Opiates: NOT DETECTED
Tetrahydrocannabinol: NOT DETECTED

## 2019-02-21 LAB — LIPID PANEL
Cholesterol: 281 mg/dL — ABNORMAL HIGH (ref 0–200)
HDL: 39 mg/dL — ABNORMAL LOW (ref >40)
LDL Cholesterol: 201 mg/dL — ABNORMAL HIGH (ref 0–99)
Total CHOL/HDL Ratio: 7.2 RATIO
Triglycerides: 203 mg/dL — ABNORMAL HIGH (ref <150)
VLDL: 41 mg/dL — ABNORMAL HIGH (ref 0–40)

## 2019-02-21 LAB — PHOSPHORUS: Phosphorus: 3.4 mg/dL (ref 2.5–4.6)

## 2019-02-21 LAB — HEMOGLOBIN A1C
Hgb A1c MFr Bld: 5.1 % (ref 4.8–5.6)
Mean Plasma Glucose: 99.67 mg/dL

## 2019-02-21 LAB — MAGNESIUM: Magnesium: 2.1 mg/dL (ref 1.7–2.4)

## 2019-02-21 MED ORDER — ADULT MULTIVITAMIN W/MINERALS CH
1.0000 | ORAL_TABLET | Freq: Every day | ORAL | Status: DC
  Administered 2019-02-21 – 2019-02-22 (×2): 1 via ORAL
  Filled 2019-02-21 (×2): qty 1

## 2019-02-21 MED ORDER — THIAMINE HCL 100 MG PO TABS
100.0000 mg | ORAL_TABLET | Freq: Every day | ORAL | Status: DC
  Administered 2019-02-21 – 2019-02-22 (×2): 100 mg via ORAL
  Filled 2019-02-21 (×2): qty 1

## 2019-02-21 MED ORDER — LORAZEPAM 1 MG PO TABS: 1.0000 mg | ORAL_TABLET | ORAL | Status: DC | PRN

## 2019-02-21 MED ORDER — LORAZEPAM 2 MG/ML IJ SOLN: 1.0000 mg | INTRAMUSCULAR | Status: DC | PRN

## 2019-02-21 MED ORDER — GADOBUTROL 1 MMOL/ML IV SOLN
10.0000 mL | Freq: Once | INTRAVENOUS | Status: AC | PRN
  Administered 2019-02-21: 14:00:00 10 mL via INTRAVENOUS

## 2019-02-21 MED ORDER — FOLIC ACID 1 MG PO TABS
1.0000 mg | ORAL_TABLET | Freq: Every day | ORAL | Status: DC
  Administered 2019-02-21 – 2019-02-22 (×2): 1 mg via ORAL
  Filled 2019-02-21 (×2): qty 1

## 2019-02-21 MED ORDER — ATORVASTATIN CALCIUM 80 MG PO TABS
80.0000 mg | ORAL_TABLET | Freq: Every day | ORAL | Status: DC
  Administered 2019-02-21: 80 mg via ORAL
  Filled 2019-02-21: qty 1

## 2019-02-21 MED ORDER — THIAMINE HCL 100 MG/ML IJ SOLN: 100.0000 mg | Freq: Every day | INTRAMUSCULAR | Status: DC

## 2019-02-21 NOTE — Progress Notes (Signed)
  Echocardiogram 2D Echocardiogram has been performed.  Stephen Terry 02/21/2019, 8:54 AM

## 2019-02-21 NOTE — Evaluation (Signed)
Occupational Therapy Evaluation Patient Details Name: Stephen Terry MRN: [id number] DOB: Feb 28, 1965 Today's Date: 02/21/2019    History of Present Illness Pt is a 54 yo male s/p L sided weakness, facial droop and slurred speech. Pt with possible acute ischemic CVA with possible vascular malformation. PMxh: alcoholism, cocaine use.   Clinical Impression   Pt PTA: living at home with s/o and grandchild. Pt reports he started a new job half of it is in home work and pt reports independence prior. Pt currently performing ADL and mobility in room with no physical assist and no LOB. Pt with slight tremors in hands, but not enough to inhibit performance with ADL/coordination tasks. Pt reports that he has good family support at home. No further assist required acutely for ADL. OT signing off.    Follow Up Recommendations  No OT follow up;Supervision - Intermittent    Equipment Recommendations  None recommended by OT    Recommendations for Other Services       Precautions / Restrictions Precautions Precautions: Fall Restrictions Weight Bearing Restrictions: No      Mobility Bed Mobility Overal bed mobility: Modified Independent                Transfers Overall transfer level: Modified independent                    Balance Overall balance assessment: Needs assistance   Sitting balance-Leahy Scale: Normal     Standing balance support: No upper extremity supported Standing balance-Leahy Scale: Good                             ADL either performed or assessed with clinical judgement   ADL Overall ADL's : At baseline                                       General ADL Comments: Pt performing sock donning at EOB and grooming at sink with no difficulty and no physical assist. Pt with slight tremors, but they do not inhibit ability to perform ADL and mobility.     Vision Baseline Vision/History: No visual deficits Patient Visual Report: No  change from baseline Vision Assessment?: No apparent visual deficits     Perception     Praxis      Pertinent Vitals/Pain Pain Assessment: No/denies pain     Hand Dominance Right   Extremity/Trunk Assessment Upper Extremity Assessment Upper Extremity Assessment: Overall WFL for tasks assessed(mild sensation changes in L hand)   Lower Extremity Assessment Lower Extremity Assessment: Defer to PT evaluation;Overall WFL for tasks assessed(mild sensation changes in LLE)   Cervical / Trunk Assessment Cervical / Trunk Assessment: Normal   Communication Communication Communication: No difficulties   Cognition Arousal/Alertness: Awake/alert Behavior During Therapy: WFL for tasks assessed/performed Overall Cognitive Status: Within Functional Limits for tasks assessed                                     General Comments  VSS on RA.    Exercises     Shoulder Instructions      Home Living Family/patient expects to be discharged to:: Private residence Living Arrangements: Spouse/significant other Available Help at Discharge: Family;Available 24 hours/day Type of Home: House Home Access: Stairs to enter [employer] of  Steps: 1   Home Layout: One level     Bathroom Shower/Tub: Tub/shower unit;Walk-in shower   Bathroom Toilet: Standard     Home Equipment: None          Prior Functioning/Environment Level of Independence: Independent        Comments: driving and working        OT Problem List: Decreased coordination      OT Treatment/Interventions:      OT Goals(Current goals can be found in the care plan section) Acute Rehab OT Goals Patient Stated Goal: home soon  OT Frequency:     Barriers to D/C:            Co-evaluation              AM-PAC OT "6 Clicks" Daily Activity     Outcome Measure Help from another person eating meals?: None Help from another person taking care of personal grooming?: None Help from  another person toileting, which includes using toliet, bedpan, or urinal?: None Help from another person bathing (including washing, rinsing, drying)?: None Help from another person to put on and taking off regular upper body clothing?: None Help from another person to put on and taking off regular lower body clothing?: None 6 Click Score: 24   End of Session Nurse Communication: Mobility status  Activity Tolerance: Patient tolerated treatment well Patient left: in chair;with call bell/phone within reach;with chair alarm set  OT Visit Diagnosis: Unsteadiness on feet (R26.81)                Time: 7829-5621 OT Time Calculation (min): 23 min Charges:  OT General Charges $OT Visit: 1 Visit OT Evaluation $OT Eval Moderate Complexity: 1 Mod  Flora Lipps OTR/L Acute Rehabilitation Services Pager: [phone] Office: [phone]   Javed Cotto C 02/21/2019, 3:44 PM

## 2019-02-21 NOTE — Progress Notes (Signed)
Patient arrived to 3W08. Alert and oriented x4. Denies pain. POC provided to patient. Call bell within reach. Nurse will continue to monitor.

## 2019-02-21 NOTE — Progress Notes (Signed)
HISTORY Awake. Shared HPI. Passed swallow. Sad to know he needs to stop drinking.  Patient received TPA yesterday feels left arm sensation and strength is improving but facial droop is unchanged.  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 Vitals:   02/21/19 0500 02/21/19 0600 02/21/19 0700 02/21/19 0800  BP: 122/72 119/67 138/70 140/79  Pulse: (!) 59 61 (!) 59 64  Resp: 14 12 14 13   Temp:    98.4 F (36.9 C)  TempSrc:    Oral  SpO2: 96% 96% 96% 97%  Weight:      Height:        CBC:  Recent Labs  Lab 02/20/19 1612  WBC 12.5*  NEUTROABS 7.7  HGB 16.6  17.0  HCT 50.2  50.0  MCV 94.7  PLT 282    Basic Metabolic Panel:  Recent Labs  Lab 02/20/19 1612  NA 134*  134*  K 4.2  4.1  CL 103  101  CO2 22  GLUCOSE 88  85  BUN 9  11  CREATININE 1.03  1.10  CALCIUM 8.8*   Lipid Panel:     Component Value Date/Time   CHOL 281 (H) 02/21/2019 0418   TRIG 203 (H) 02/21/2019 0418   HDL 39 (L) 02/21/2019 0418   CHOLHDL 7.2 02/21/2019 0418   VLDL 41 (H) 02/21/2019 0418   LDLCALC 201 (H) 02/21/2019 0418   HgbA1c:  Lab Results  Component Value Date   HGBA1C 5.1 02/21/2019   Urine Drug Screen: No results found for: LABOPIA, COCAINSCRNUR, LABBENZ, AMPHETMU, THCU, LABBARB  Alcohol Level     Component Value Date/Time   ETH 40 (H) 02/20/2019 1731    IMAGING past 48 hours CT ANGIO HEAD W OR WO CONTRAST  Result Date: 02/20/2019 CLINICAL DATA:  54 year old male code stroke presentation with left side weakness. IV tPA given. 8 mm nodular hyperdensity along the right tentorium on plain CT, favored to be small meningioma. EXAM: CT ANGIOGRAPHY HEAD AND NECK CT PERFUSION BRAIN TECHNIQUE: Multidetector CT imaging of the head and neck was performed using the standard protocol during bolus administration of intravenous contrast.  Multiplanar CT image reconstructions and MIPs were obtained to evaluate the vascular anatomy. Carotid stenosis measurements (when applicable) are obtained utilizing NASCET criteria, using the distal internal carotid diameter as the denominator. Multiphase CT imaging of the brain was performed following IV bolus contrast injection. Subsequent parametric perfusion maps were calculated using RAPID software. CONTRAST:  100 milliliters Omnipaque 350 COMPARISON:  Head CT without contrast 1609 hours today. FINDINGS: CT Brain Perfusion Findings: ASPECTS: 10 CBF (<30%) Volume: None Perfusion (Tmax>6.0s) volume: None Mismatch Volume: Not applicable Infarction Location:Not applicable CTA NECK Skeleton: Chronic sinusitis. No acute osseous abnormality identified. Upper chest: Negative. No superior mediastinal lymphadenopathy. Other neck: Negative neck soft tissues. Aortic arch: 3 vessel arch configuration with no arch atherosclerosis. Right carotid system: No brachiocephalic or right CCA origin plaque or stenosis. Negative right carotid bifurcation. Negative cervical right ICA. Left carotid system: Negative. Vertebral arteries: Both proximal subclavian arteries and cervical vertebral arteries are negative. The left vertebral is mildly dominant. CTA HEAD Posterior circulation: Mildly dominant left vertebral artery, the right V4 segment decreases in size beyond the PICA. Normal PICA origins. No distal vertebral or vertebrobasilar junction stenosis. Patent basilar artery without stenosis. Patent SCA and PCA origins. Posterior communicating arteries are diminutive or absent. Bilateral  PCA branches are within normal limits. Anterior circulation: Both ICA siphons are patent. Mild siphon calcified plaque which appears greater on the left, without hemodynamically significant stenosis. Patent carotid termini, MCA and ACA origins. Anterior communicating artery and bilateral ACA branches are within normal limits. Left MCA M1 and  trifurcation are patent without stenosis. Left MCA branches are within normal limits. Right MCA M1 and bifurcation are patent without stenosis. Right MCA branches appear within normal limits. Venous sinuses: Patent. There is abnormal venous vascularity tracking from the right vein of Galen along the straight sinus and to the right tentorium and sigmoid sinus as seen on series 5, images 55 and 57. Arterial supply to this vascular malformation is unclear, perhaps the right PCA, although that vessel is minimally enlarged if at all compared to the left PCA. However, there is mild asymmetry of the right MCA branches with respect to the right vein of lobe a (series 8 image 15), although it is not clear that this finding communicates with the other venous abnormality. Anatomic variants: None. Other findings: Delayed postcontrast images provided for further evaluation of the right tentorial abnormality. These redemonstrate abnormal vascularity in the a posterior medial right parieto-occipital sulcus and occiput in association with the abnormal venous vascularity described earlier. No other abnormal intracranial enhancement identified. Review of the MIP images confirms the above findings IMPRESSION: 1. Negative for large vessel occlusion and no infarct or ischemia detected by CTP. 2. Positive for abnormal venous vascularity in the posterior right hemisphere which accounts for the increased right tentorial density on the plain CT. 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 Neuro-endovascular follow-up is recommended. 3. No atherosclerosis or stenosis in the neck. Mild ICA siphon calcified plaque without stenosis. 4. 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 Preliminary sent via AMION to Dr. Milon Dikes on 02/20/2019 at 1636 hours. Final report communicated to Dr. Wilford Corner at 1655 hours by text page via the [facility] messaging system.  Electronically Signed   By: Odessa Fleming M.D.   On: 02/20/2019 16:59   CT ANGIO NECK W OR WO CONTRAST  Result Date: 02/20/2019 CLINICAL DATA:  54 year old male code stroke presentation with left side weakness. IV tPA given. 8 mm nodular hyperdensity along the right tentorium on plain CT, favored to be small meningioma. EXAM: CT ANGIOGRAPHY HEAD AND NECK CT PERFUSION BRAIN TECHNIQUE: Multidetector CT imaging of the head and neck was performed using the standard protocol during bolus administration of intravenous contrast. Multiplanar CT image reconstructions and MIPs were obtained to evaluate the vascular anatomy. Carotid stenosis measurements (when applicable) are obtained utilizing NASCET criteria, using the distal internal carotid diameter as the denominator. Multiphase CT imaging of the brain was performed following IV bolus contrast injection. Subsequent parametric perfusion maps were calculated using RAPID software. CONTRAST:  100 milliliters Omnipaque 350 COMPARISON:  Head CT without contrast 1609 hours today. FINDINGS: CT Brain Perfusion Findings: ASPECTS: 10 CBF (<30%) Volume: None Perfusion (Tmax>6.0s) volume: None Mismatch Volume: Not applicable Infarction Location:Not applicable CTA NECK Skeleton: Chronic sinusitis. No acute osseous abnormality identified. Upper chest: Negative. No superior mediastinal lymphadenopathy. Other neck: Negative neck soft tissues. Aortic arch: 3 vessel arch configuration with no arch atherosclerosis. Right carotid system: No brachiocephalic or right CCA origin plaque or stenosis. Negative right carotid bifurcation. Negative cervical right ICA. Left carotid system: Negative. Vertebral arteries: Both proximal subclavian arteries and cervical vertebral arteries are negative. The left vertebral is mildly dominant.  CTA HEAD Posterior circulation: Mildly dominant left vertebral artery, the right V4 segment decreases in size beyond the PICA. Normal PICA origins. No distal vertebral or  vertebrobasilar junction stenosis. Patent basilar artery without stenosis. Patent SCA and PCA origins. Posterior communicating arteries are diminutive or absent. Bilateral PCA branches are within normal limits. Anterior circulation: Both ICA siphons are patent. Mild siphon calcified plaque which appears greater on the left, without hemodynamically significant stenosis. Patent carotid termini, MCA and ACA origins. Anterior communicating artery and bilateral ACA branches are within normal limits. Left MCA M1 and trifurcation are patent without stenosis. Left MCA branches are within normal limits. Right MCA M1 and bifurcation are patent without stenosis. Right MCA branches appear within normal limits. Venous sinuses: Patent. There is abnormal venous vascularity tracking from the right vein of Galen along the straight sinus and to the right tentorium and sigmoid sinus as seen on series 5, images 55 and 57. Arterial supply to this vascular malformation is unclear, perhaps the right PCA, although that vessel is minimally enlarged if at all compared to the left PCA. However, there is mild asymmetry of the right MCA branches with respect to the right vein of lobe a (series 8 image 15), although it is not clear that this finding communicates with the other venous abnormality. Anatomic variants: None. Other findings: Delayed postcontrast images provided for further evaluation of the right tentorial abnormality. These redemonstrate abnormal vascularity in the a posterior medial right parieto-occipital sulcus and occiput in association with the abnormal venous vascularity described earlier. No other abnormal intracranial enhancement identified. Review of the MIP images confirms the above findings IMPRESSION: 1. Negative for large vessel occlusion and no infarct or ischemia detected by CTP. 2. Positive for abnormal venous vascularity in the posterior right hemisphere which accounts for the increased right tentorial density on  the plain CT. 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 Neuro-endovascular follow-up is recommended. 3. No atherosclerosis or stenosis in the neck. Mild ICA siphon calcified plaque without stenosis. 4. 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 Preliminary sent via AMION to Dr. Amie Portland on 02/20/2019 at 1636 hours. Final report communicated to Dr. Rory Percy at 1655 hours by text page via the [facility] messaging system. Electronically Signed   By: Genevie Ann M.D.   On: 02/20/2019 16:59   CT CEREBRAL PERFUSION W CONTRAST  Result Date: 02/20/2019 CLINICAL DATA:  54 year old male code stroke presentation with left side weakness. IV tPA given. 8 mm nodular hyperdensity along the right tentorium on plain CT, favored to be small meningioma. EXAM: CT ANGIOGRAPHY HEAD AND NECK CT PERFUSION BRAIN TECHNIQUE: Multidetector CT imaging of the head and neck was performed using the standard protocol during bolus administration of intravenous contrast. Multiplanar CT image reconstructions and MIPs were obtained to evaluate the vascular anatomy. Carotid stenosis measurements (when applicable) are obtained utilizing NASCET criteria, using the distal internal carotid diameter as the denominator. Multiphase CT imaging of the brain was performed following IV bolus contrast injection. Subsequent parametric perfusion maps were calculated using RAPID software. CONTRAST:  100 milliliters Omnipaque 350 COMPARISON:  Head CT without contrast 1609 hours today. FINDINGS: CT Brain Perfusion Findings: ASPECTS: 10 CBF (<30%) Volume: None Perfusion (Tmax>6.0s) volume: None Mismatch Volume: Not applicable Infarction Location:Not applicable CTA NECK Skeleton: Chronic sinusitis. No acute osseous abnormality identified. Upper chest: Negative. No superior mediastinal lymphadenopathy. Other neck: Negative neck soft tissues. Aortic arch: 3 vessel arch configuration with  no arch atherosclerosis. Right carotid system: No brachiocephalic or right CCA origin plaque or stenosis. Negative right carotid bifurcation. Negative cervical right ICA. Left carotid system: Negative. Vertebral arteries: Both proximal subclavian arteries and cervical vertebral arteries are negative. The left vertebral is mildly dominant. CTA HEAD Posterior circulation: Mildly dominant left vertebral artery, the right V4 segment decreases in size beyond the PICA. Normal PICA origins. No distal vertebral or vertebrobasilar junction stenosis. Patent basilar artery without stenosis. Patent SCA and PCA origins. Posterior communicating arteries are diminutive or absent. Bilateral PCA branches are within normal limits. Anterior circulation: Both ICA siphons are patent. Mild siphon calcified plaque which appears greater on the left, without hemodynamically significant stenosis. Patent carotid termini, MCA and ACA origins. Anterior communicating artery and bilateral ACA branches are within normal limits. Left MCA M1 and trifurcation are patent without stenosis. Left MCA branches are within normal limits. Right MCA M1 and bifurcation are patent without stenosis. Right MCA branches appear within normal limits. Venous sinuses: Patent. There is abnormal venous vascularity tracking from the right vein of Galen along the straight sinus and to the right tentorium and sigmoid sinus as seen on series 5, images 55 and 57. Arterial supply to this vascular malformation is unclear, perhaps the right PCA, although that vessel is minimally enlarged if at all compared to the left PCA. However, there is mild asymmetry of the right MCA branches with respect to the right vein of lobe a (series 8 image 15), although it is not clear that this finding communicates with the other venous abnormality. Anatomic variants: None. Other findings: Delayed postcontrast images provided for further evaluation of the right tentorial abnormality. These  redemonstrate abnormal vascularity in the a posterior medial right parieto-occipital sulcus and occiput in association with the abnormal venous vascularity described earlier. No other abnormal intracranial enhancement identified. Review of the MIP images confirms the above findings IMPRESSION: 1. Negative for large vessel occlusion and no infarct or ischemia detected by CTP. 2. Positive for abnormal venous vascularity in the posterior right hemisphere which accounts for the increased right tentorial density on the plain CT. 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 Neuro-endovascular follow-up is recommended. 3. No atherosclerosis or stenosis in the neck. Mild ICA siphon calcified plaque without stenosis. 4. 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 Preliminary sent via AMION to Dr. Milon Dikes on 02/20/2019 at 1636 hours. Final report communicated to Dr. Wilford Corner at 1655 hours by text page via the [facility] messaging system. Electronically Signed   By: Odessa Fleming M.D.   On: 02/20/2019 16:59   CT HEAD CODE STROKE WO CONTRAST  Result Date: 02/20/2019 CLINICAL DATA:  Code stroke.  54 year old male left side weakness. EXAM: CT HEAD WITHOUT CONTRAST TECHNIQUE: Contiguous axial images were obtained from the base of the skull through the vertex without intravenous contrast. COMPARISON:  None. FINDINGS: Brain: No midline shift, mass effect, or evidence of intracranial mass lesion. No ventriculomegaly. There is a small 7-8 millimeter area of nodular hyperdensity along the junction of the right transverse and sigmoid sinuses on coronal image 52. This does not strongly resemble an acute intracranial hemorrhage, might be a small meningioma, uncertain. No other intracranial blood identified. Small round hypodensity at the right external capsule on series 3, image 15 is age indeterminate. There is mild right frontal lobe subcortical white  matter hypodensity on series 3, image 20. No CT changes of acute cortically based infarct identified.  No suspicious intracranial vascular hyperdensity. Vascular: Calcified atherosclerosis at the skull base. Skull: No acute osseous abnormality identified. Sinuses/Orbits: Pansinus inflammatory changes with periosteal thickening and probable polyposis. Tympanic cavities and mastoids clear. Other: Visualized orbit soft tissues are within normal limits. Small midline forehead scalp lipoma. No acute scalp soft tissue finding. ASPECTS [facility]([place] Stroke Program Early CT Score) Total score (0-10 with 10 being normal): 10 IMPRESSION: 1. Indeterminate 8 mm nodular hyperdensity along the right lateral tentorium. But favor small meningioma rather than acute parenchymal hemorrhage. 2. There are small age indeterminate white matter changes in the right frontal lobe subcortical white matter. But no CT changes of acute cortically based infarct identified. Therefore ASPECTS 10. 3. 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 Salient findings were communicated to Dr. Wilford CornerArora at 4:16 pmon 02/20/2019 by text page via the [facility] messaging [facility]. And also discussed by telephone with him at 16:18 . Electronically Signed   By: Odessa FlemingH  Hall M.D.   On: 02/20/2019 16:19    PHYSICAL EXAM Pleasant middle-aged Caucasian male not in distress. . Afebrile. Head is nontraumatic. Neck is supple without bruit.    Cardiac exam no murmur or gallop. Lungs are clear to auscultation. Distal pulses are well felt. Neurological Exam :  Awake alert oriented to time place and person.  Mild dysarthria.  No aphasia.  Extraocular movements full range without nystagmus.  Left lower facial weakness.  Tongue midline.  Motor system exam no upper or lower extremity drift.  Mild weakness of left grip and intrinsic hand muscles.  Orbits right over left upper extremity.  Mild subjective diminished sensation in the left upper extremity but equal sensation in  lower extremities.  Coordination is intact.  Gait not tested.  NIH stroke scale 3.  Baseline modified Rankin score is 0. ASSESSMENT/PLAN Mr. Stephen Terry is a 54 y.o. male with no significant past medical history presenting with L facial droop and slurred speech. Received tPA 02/20/19 at 1613.  Stroke:   R brain infarct s/p tPA secondary to unknown source, workup underway  Code Stroke CT head R lateral tentorium hyperdensity ? Meningioma. R frontal lobe white matter changes. ASPECTS 10. Sinus dz.    CTA head & neck posterior R tentorial AVM. Sinus dz.   CT perfusion no LVO or infarct   MRI w/w/o right posterior frontal cortical and subcortical infarct   2D Echo normal ejection fraction 60 to 65%.  Mild diastolic dysfunction.  No clot.  LDL 201  HgbA1c 5.1  SCDs for VTE prophylaxis  No antithrombotic prior to admission, now on No antithrombotic as within 24h of tPA administration.    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 Meningioma. R frontal lobe white matter changes. ASPECTS 10. Sinus dz.   CTA head & neck posterior R tentorial AVM. Sinus dz.  Change MRI to w/w/o to better qualify  Blood Pressure  No hx of Hypertension, not on Home meds  BP Stable BP goal per post tPA protocol x 24h following tPA administration . Long-term BP goal normotensive  Hyperlipidemia  Home meds:  No statin  Add lipitor 80    LDL 201, goal < 70  Continue statin at discharge  Other Stroke Risk Factors  ETOH abuse, alcohol level 40, advised to drink no more than 2 drink(s) a day. Added CIWA protocol for med surg pt  UDS pending   Overweight, Body mass index is 29.48 kg/m., recommend weight loss, diet and exercise as appropriate   Other Active Problems  Leukocytosis WBC 12.5. afebrile,    Hospital day # 1 He presented with sudden onset of slurred  speech left facial droop and left arm numbness secondary to embolic right frontal cortical and subcortical infarct.  Continue close neurological monitoring and strict blood pressure control as per post TPA protocol.  During ongoing stroke work-up.  Patient counseled to quit drinking alcohol and is agreeable.  Mobilize out of bed.  Therapy consults.  Dual antiplatelet therapy for 3 weeks followed by aspirin.  Patient may also consider possible participation in the [employer] stroke prevention trial if interested.This patient is critically ill and at significant risk of neurological worsening, death and care requires constant monitoring of vital signs, hemodynamics,respiratory and cardiac monitoring, extensive review of multiple databases, frequent neurological assessment, discussion with family, other specialists and medical decision making of high complexity.I have made any additions or clarifications directly to the above note.This critical care time does not reflect procedure time, or teaching time or supervisory time of PA/NP/Med Resident etc but could involve care discussion time.  I spent 30 minutes of neurocritical care time  in the care of  this patient.     Delia HeadyPramod Zadin Lange, MD  To contact Stroke Continuity provider, please refer to [email or web address]. After hours, contact General Neurology

## 2019-02-21 NOTE — Progress Notes (Signed)
Physical Therapy Evaluation Patient Details Name: Stephen Terry MRN: [id number] DOB: 1965/08/05 Today's Date: 02/21/2019   History of Present Illness  Pt is a 54 yo male s/p L sided weakness, facial droop and slurred speech. Pt with possible acute ischemic CVA with possible vascular malformation. PMxh: alcoholism, cocaine use.  Clinical Impression  Pt admitted with/for s/s of stroke.  Mostly resolved or improving.  Still needs supervision with higher level balance activity, which will be checked next session..  Pt currently limited functionally due to the problems listed below.  (see problems list.)  Pt will benefit from PT to maximize function and safety to be able to get home safely with available assist.     Follow Up Recommendations No PT follow up;Supervision/Assistance - 24 hour    Equipment Recommendations  None recommended by PT    Recommendations for Other Services       Precautions / Restrictions Precautions Precautions: Fall Restrictions Weight Bearing Restrictions: No      Mobility  Bed Mobility Overal bed mobility: Modified Independent                Transfers Overall transfer level: Modified independent                  Ambulation/Gait Ambulation/Gait assistance: Supervision Gait Distance (Feet): 280 Feet Assistive device: None Gait Pattern/deviations: Step-through pattern Gait velocity: guarded and insignificant changes in speed. Gait velocity interpretation: 1.31 - 2.62 ft/sec, indicative of limited community ambulator General Gait Details: generally steady with minimal challenge to balance including scanning and backing up and change of speed  Stairs            Wheelchair Mobility    Modified Rankin (Stroke Patients Only) Modified Rankin (Stroke Patients Only) Pre-Morbid Rankin Score: No symptoms Modified Rankin: Slight disability     Balance Overall balance assessment: Needs assistance   Sitting balance-Leahy Scale: Normal      Standing balance support: No upper extremity supported Standing balance-Leahy Scale: Good                               Pertinent Vitals/Pain Pain Assessment: No/denies pain    Home Living Family/patient expects to be discharged to:: Private residence Living Arrangements: Spouse/significant other Available Help at Discharge: Family;Available 24 hours/day Type of Home: House Home Access: Stairs to enter   [employer] of Steps: 1 Home Layout: One level Home Equipment: None      Prior Function Level of Independence: Independent         Comments: driving and working     Hand Dominance   Dominant Hand: Right    Extremity/Trunk Assessment   Upper Extremity Assessment Upper Extremity Assessment: Overall WFL for tasks assessed(functional with mild sensory changes L hand and UE)    Lower Extremity Assessment Lower Extremity Assessment: Overall WFL for tasks assessed(strength WFL, mild coordination and sensory issues L LE)    Cervical / Trunk Assessment Cervical / Trunk Assessment: Normal  Communication   Communication: No difficulties  Cognition Arousal/Alertness: Awake/alert Behavior During Therapy: WFL for tasks assessed/performed Overall Cognitive Status: Within Functional Limits for tasks assessed                                        General Comments      Exercises     Assessment/Plan  PT Assessment Patient needs continued PT services  PT Problem List Decreased strength;Decreased balance;Decreased mobility;Decreased coordination;Pain;Impaired sensation       PT Treatment Interventions Gait training;Stair training;Functional mobility training;Therapeutic activities;Patient/family education;Balance training    PT Goals (Current goals can be found in the Care Plan section)  Acute Rehab PT Goals Patient Stated Goal: home soon PT Goal Formulation: With patient Time For Goal Achievement: 02/23/19 Potential  to Achieve Goals: Good    Frequency Min 3X/week   Barriers to discharge        Co-evaluation               AM-PAC PT "6 Clicks" Mobility  Outcome Measure Help needed turning from your back to your side while in a flat bed without using bedrails?: None Help needed moving from lying on your back to sitting on the side of a flat bed without using bedrails?: None Help needed moving to and from a bed to a chair (including a wheelchair)?: None Help needed standing up from a chair using your arms (e.g., wheelchair or bedside chair)?: None Help needed to walk in hospital room?: None Help needed climbing 3-5 steps with a railing? : A Little 6 Click Score: 23    End of Session   Activity Tolerance: Patient tolerated treatment well Patient left: in chair;with call bell/phone within reach Nurse Communication: Mobility status PT Visit Diagnosis: Unsteadiness on feet (R26.81);Other symptoms and signs involving the nervous system (R29.898)    Time: 2409-7353 PT Time Calculation (min) (ACUTE ONLY): 23 min   Charges:   PT Evaluation $PT Eval Low Complexity: 1 Low          02/21/2019  Ginger Carne., PT Acute Rehabilitation Services [phone]  (pager) [phone]  (office)  Stephen Terry 02/21/2019, 12:34 PM

## 2019-02-22 ENCOUNTER — Telehealth: Payer: Self-pay | Admitting: *Deleted

## 2019-02-22 ENCOUNTER — Other Ambulatory Visit: Payer: Self-pay | Admitting: Medical

## 2019-02-22 DIAGNOSIS — E663 Overweight: Secondary | ICD-10-CM | POA: Diagnosis present

## 2019-02-22 DIAGNOSIS — Q282 Arteriovenous malformation of cerebral vessels: Secondary | ICD-10-CM

## 2019-02-22 DIAGNOSIS — E785 Hyperlipidemia, unspecified: Secondary | ICD-10-CM | POA: Diagnosis present

## 2019-02-22 DIAGNOSIS — I639 Cerebral infarction, unspecified: Secondary | ICD-10-CM

## 2019-02-22 DIAGNOSIS — F1010 Alcohol abuse, uncomplicated: Secondary | ICD-10-CM | POA: Diagnosis present

## 2019-02-22 LAB — CBC
HCT: 45.3 % (ref 39.0–52.0)
Hemoglobin: 15 g/dL (ref 13.0–17.0)
MCH: 31.4 pg (ref 26.0–34.0)
MCHC: 33.1 g/dL (ref 30.0–36.0)
MCV: 94.8 fL (ref 80.0–100.0)
Platelets: 190 10*3/uL (ref 150–400)
RBC: 4.78 MIL/uL (ref 4.22–5.81)
RDW: 12.2 % (ref 11.5–15.5)
WBC: 7.9 10*3/uL (ref 4.0–10.5)
nRBC: 0 % (ref 0.0–0.2)

## 2019-02-22 LAB — BASIC METABOLIC PANEL
Anion gap: 7 (ref 5–15)
BUN: 9 mg/dL (ref 6–20)
CO2: 24 mmol/L (ref 22–32)
Calcium: 8.3 mg/dL — ABNORMAL LOW (ref 8.9–10.3)
Chloride: 107 mmol/L (ref 98–111)
Creatinine, Ser: 1.03 mg/dL (ref 0.61–1.24)
GFR calc Af Amer: >60 mL/min (ref >60)
GFR calc non Af Amer: >60 mL/min (ref >60)
Glucose, Bld: 91 mg/dL (ref 70–99)
Potassium: 3.8 mmol/L (ref 3.5–5.1)
Sodium: 138 mmol/L (ref 135–145)

## 2019-02-22 MED ORDER — ATORVASTATIN CALCIUM 80 MG PO TABS: 80.0000 mg | ORAL_TABLET | Freq: Every day | ORAL | 2 refills | Status: AC

## 2019-02-22 MED ORDER — CLOPIDOGREL BISULFATE 75 MG PO TABS: 75.0000 mg | ORAL_TABLET | Freq: Every day | ORAL | 0 refills | Status: AC

## 2019-02-22 MED ORDER — CLOPIDOGREL BISULFATE 75 MG PO TABS
75.0000 mg | ORAL_TABLET | Freq: Every day | ORAL | Status: DC
  Administered 2019-02-22: 14:00:00 75 mg via ORAL
  Filled 2019-02-22: qty 1

## 2019-02-22 MED ORDER — ASPIRIN EC 81 MG PO TBEC
81.0000 mg | DELAYED_RELEASE_TABLET | Freq: Every day | ORAL | Status: DC
  Administered 2019-02-22: 14:00:00 81 mg via ORAL
  Filled 2019-02-22: qty 1

## 2019-02-22 MED ORDER — ASPIRIN 81 MG PO TBEC: 81.0000 mg | DELAYED_RELEASE_TABLET | Freq: Every day | ORAL | Status: DC

## 2019-02-22 NOTE — Plan of Care (Signed)
Independent with adl's

## 2019-02-22 NOTE — Evaluation (Signed)
Speech Language Pathology Evaluation Patient Details Name: Stephen Terry MRN: [id number] DOB: Aug 18, 1965 Today's Date: 02/22/2019 Time: 4098-1191 SLP Time Calculation (min) (ACUTE ONLY): 25 min  Problem List:  Patient Active Problem List   Diagnosis Date Noted  . Acute ischemic stroke (HCC) 02/20/2019   Past Medical History:  Past Medical History:  Diagnosis Date  . Hypertension    Past Surgical History: History reviewed. No pertinent surgical history. HPI:  Pt is a 54 yo male s/p L sided weakness, facial droop and slurred speech. Received tPa. MRI 2/1: Small acute posterior right frontal lobe infarct. PMxh: alcoholism, cocaine use.   Assessment / Plan / Recommendation Clinical Impression   Patient received in room for speech/language evaluation s/p stroke with tPa. Patient alert, oriented x4. Pt noted to have facial asymmetry/weakness on left. Patient's speech is fluent, very minimal articulatory imprecision observed, but 100% intelligible. Patient's expressive and receptive language skills are [facility]. He is able to answer basic and complex yes/no questions, follow multi-step verbal commands and demonstrates ability to engage in simple conversation. Automatic speech and repetition intact. Patient able to name items in confrontation naming task without difficulty and denies word-finding problems. Reading informally assessed at the sentence level and found to be intact. Patient given the memory and attention subtests of the COGNISTAT. Scores were [facility].  COGNISTAT Attention: 8/8 WFL Memory: 10/12 WFL No further speech therapy services are warranted at this time. ST educated patient re: BEFAST acronym for stroke recognition.  Please re-consult ST should new needs arise. Thank you.    SLP Assessment  SLP Recommendation/Assessment: Patient does not need any further Speech Lanaguage Pathology Services SLP Visit Diagnosis: Dysarthria and anarthria (R47.1)    Follow Up Recommendations  None;Outpatient SLP    Frequency and Duration           SLP Evaluation Cognition  Overall Cognitive Status: Within Functional Limits for tasks assessed Arousal/Alertness: Awake/alert Orientation Level: Oriented X4 Attention: Sustained Sustained Attention: Appears intact Memory: Appears intact Awareness: Appears intact Problem Solving: Appears intact Safety/Judgment: Appears intact       Comprehension  Auditory Comprehension Overall Auditory Comprehension: Appears within functional limits for tasks assessed Yes/No Questions: Within Functional Limits Commands: Within Functional Limits Reading Comprehension Reading Status: Within funtional limits    Expression Expression Primary Mode of Expression: Verbal Verbal Expression Overall Verbal Expression: Appears within functional limits for tasks assessed Initiation: No impairment Automatic Speech: Counting Level of Generative/Spontaneous Verbalization: Sentence;Conversation Repetition: No impairment Naming: No impairment Written Expression Dominant Hand: Right Written Expression: Not tested   Oral / Motor  Oral Motor/Sensory Function Overall Oral Motor/Sensory Function: Mild impairment Facial ROM: Reduced left Facial Symmetry: Abnormal symmetry left Motor Speech Overall Motor Speech: Appears within functional limits for tasks assessed Intelligibility: Intelligible   GO            Shella Spearing, M.Ed., CCC-SLP Speech Therapy Acute Rehabilitation [phone]: Acute Rehab office [phone] - pager          Stephen Terry 02/22/2019, 10:10 AM

## 2019-02-22 NOTE — Progress Notes (Signed)
Physical Therapy Treatment Patient Details Name: Stephen Terry MRN: [id number] DOB: 22-Feb-1965 Today's Date: 02/22/2019    History of Present Illness Pt is a 54 yo male s/p L sided weakness, facial droop and slurred speech. Pt with possible acute ischemic CVA with possible vascular malformation. PMxh: alcoholism, cocaine use.    PT Comments    Pt ambulated 350' without an assistive device with no loss of balance. He performed high level balance assessment without difficulty. Pt also ascended/descended 5 stairs without use of handrails without loss of balance. HR 101 max with activity. He has met PT goals and is ready to DC home from PT standpoint. No further PT indicated, will sign off.    Follow Up Recommendations  No PT follow up     Equipment Recommendations  None recommended by PT    Recommendations for Other Services       Precautions / Restrictions Precautions Precautions: None Restrictions Weight Bearing Restrictions: No    Mobility  Bed Mobility Overal bed mobility: Independent                Transfers Overall transfer level: Independent Equipment used: None                Ambulation/Gait Ambulation/Gait assistance: Independent Gait Distance (Feet): 350 Feet Assistive device: None Gait Pattern/deviations: WFL(Within Functional Limits) Gait velocity: WNL   General Gait Details: no loss of balance with head turns, HR 101 max walking   Stairs Stairs: Yes Stairs assistance: Independent Stair Management: No rails Number of Stairs: 5 General stair comments: steady, no loss of balance   Wheelchair Mobility    Modified Rankin (Stroke Patients Only) Modified Rankin (Stroke Patients Only) Pre-Morbid Rankin Score: No symptoms Modified Rankin: No symptoms     Balance Overall balance assessment: Needs assistance   Sitting balance-Leahy Scale: Normal     Standing balance support: No upper extremity supported Standing balance-Leahy Scale:  Normal                 High Level Balance Comments: pt able to stand with feet together, turn 360*, stand with eyes closed 10 seconds, reach forward, and look over both shoulders in standing all without loss of balance            Cognition Arousal/Alertness: Awake/alert Behavior During Therapy: WFL for tasks assessed/performed Overall Cognitive Status: Within Functional Limits for tasks assessed                                        Exercises      General Comments        Pertinent Vitals/Pain Pain Assessment: No/denies pain    Home Living     Available Help at Discharge: Family;Available 24 hours/day Type of Home: House              Prior Function            PT Goals (current goals can now be found in the care plan section) Acute Rehab PT Goals Patient Stated Goal: home to his 3 dogs, return to work in mental health PT Goal Formulation: All assessment and education complete, DC therapy Time For Goal Achievement: 02/23/19 Potential to Achieve Goals: Good Progress towards PT goals: Goals met/education completed, patient discharged from PT    Frequency    Min 3X/week      PT Plan Current plan remains appropriate  Co-evaluation              AM-PAC PT "6 Clicks" Mobility   Outcome Measure  Help needed turning from your back to your side while in a flat bed without using bedrails?: None Help needed moving from lying on your back to sitting on the side of a flat bed without using bedrails?: None Help needed moving to and from a bed to a chair (including a wheelchair)?: None Help needed standing up from a chair using your arms (e.g., wheelchair or bedside chair)?: None Help needed to walk in hospital room?: None Help needed climbing 3-5 steps with a railing? : None 6 Click Score: 24    End of Session Equipment Utilized During Treatment: Gait belt Activity Tolerance: Patient tolerated treatment well Patient left: with  call bell/phone within reach;in bed Nurse Communication: Mobility status PT Visit Diagnosis: Unsteadiness on feet (R26.81);Other symptoms and signs involving the nervous system (R29.898)     Time: 1222-4114 PT Time Calculation (min) (ACUTE ONLY): 12 min  Charges:  $Gait Training: 8-22 mins                     Blondell Reveal Kistler PT 02/22/2019  Acute Rehabilitation Services Pager [phone] Office [phone]

## 2019-02-22 NOTE — TOC Transition Note (Addendum)
Transition of Care [facility]) - CM/SW Discharge Note   Patient Details  Name: Stephen Terry MRN: [id number] Date of Birth: Jun 08, 1965  [facility] of [facility] CM/SW Contact:  Kermit Balo, RN Phone Number: 02/22/2019, 2:07 PM   Clinical Narrative:    Pt discharging home with self care. No f/u per PT/OT and no DME needs.  CM consulted for substance abuse. CM provided him online, outpatient, inpatient counseling resources. Pt expressed appreciation. PCP: [facility] in [place] Pt has supervision at home and transportation to home.    Final next level of care: Home/Self Care Barriers to Discharge: No Barriers Identified   Patient Goals and CMS Choice        Discharge Placement                       Discharge Plan and Services   Discharge Planning Services: CM Consult                                 Social Determinants of Health (SDOH) Interventions     Readmission Risk Interventions No flowsheet data found.

## 2019-02-22 NOTE — Discharge Summary (Addendum)
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   atorvastatin 80 MG tablet Commonly known as: LIPITOR Take 1 tablet (80 mg total) by mouth daily at 6 PM.   clopidogrel 75 MG tablet Commonly known as: PLAVIX Take 1 tablet (75 mg total) by mouth daily for 21 days.   lamoTRIgine 200 MG tablet Commonly known as: LAMICTAL Take 200 mg by mouth daily.   propranolol 20 MG tablet Commonly known as: INDERAL Take 20-40 mg by mouth 3 (three) times daily as needed (For tremers).   sertraline 100 MG tablet Commonly known as: ZOLOFT Take 150 mg by mouth daily.       LABORATORY STUDIES CBC    Component Value Date/Time   WBC 7.9 02/22/2019 0526   RBC 4.78 02/22/2019 0526   HGB 15.0 02/22/2019 0526   HCT 45.3 02/22/2019 0526   PLT 190 02/22/2019 0526   MCV 94.8 02/22/2019 0526   MCH 31.4 02/22/2019 0526   MCHC 33.1 02/22/2019 0526   RDW 12.2 02/22/2019 0526   LYMPHSABS 3.1 02/20/2019 1612   MONOABS 1.0 02/20/2019 1612   EOSABS 0.7 (H) 02/20/2019 1612   BASOSABS 0.1 02/20/2019 1612   CMP    Component Value Date/Time   NA 138 02/22/2019 0526   K 3.8 02/22/2019 0526   CL 107 02/22/2019 0526   CO2 24 02/22/2019 0526   GLUCOSE 91 02/22/2019 0526   BUN 9 02/22/2019 0526   CREATININE 1.03 02/22/2019 0526   CALCIUM 8.3 (L) 02/22/2019 0526   PROT 6.8  02/20/2019 1612   ALBUMIN 3.6 02/20/2019 1612   AST 28 02/20/2019 1612   ALT 40 02/20/2019 1612   ALKPHOS 99 02/20/2019 1612   BILITOT 0.9 02/20/2019 1612   GFRNONAA >60 02/22/2019 0526   GFRAA >60 02/22/2019 0526   COAGS Lab Results  Component Value Date   INR 0.9 02/20/2019   Lipid Panel    Component Value Date/Time   CHOL 281 (H) 02/21/2019 0418   TRIG 203 (H) 02/21/2019 0418   HDL 39 (L) 02/21/2019 0418   CHOLHDL 7.2 02/21/2019 0418   VLDL 41 (H) 02/21/2019 0418   LDLCALC 201 (H) 02/21/2019 0418   HgbA1C  Lab Results  Component Value Date   HGBA1C 5.1 02/21/2019   Urine Drug Screen     Component Value Date/Time   LABOPIA NONE DETECTED 02/21/2019 1131   COCAINSCRNUR NONE DETECTED 02/21/2019 1131   LABBENZ NONE DETECTED 02/21/2019 1131   AMPHETMU NONE DETECTED 02/21/2019 1131   THCU NONE DETECTED 02/21/2019 1131   LABBARB NONE DETECTED 02/21/2019 1131    Alcohol Level    Component Value Date/Time   ETH 40 (H) 02/20/2019 1731    SIGNIFICANT DIAGNOSTIC STUDIES CT ANGIO HEAD W OR WO CONTRAST  Result Date: 02/20/2019 CLINICAL DATA:  54 year old male code stroke presentation with left side weakness. IV tPA given. 8 mm nodular hyperdensity along the right tentorium on plain CT, favored to be small meningioma. EXAM: CT ANGIOGRAPHY HEAD AND NECK CT PERFUSION BRAIN TECHNIQUE: Multidetector CT imaging of the head and neck was performed using the standard protocol during bolus administration of intravenous contrast. Multiplanar CT image reconstructions and MIPs were obtained to evaluate the vascular anatomy. Carotid stenosis measurements (when applicable) are obtained utilizing NASCET criteria, using the distal internal carotid diameter as the denominator. Multiphase CT imaging of the brain was performed following IV bolus contrast injection. Subsequent parametric perfusion maps were calculated using RAPID software. CONTRAST:  100 milliliters Omnipaque 350 COMPARISON:  Head CT  without contrast 1609 hours today. FINDINGS: CT Brain Perfusion Findings: ASPECTS: 10 CBF (<30%) Volume: None Perfusion (Tmax>6.0s) volume: None Mismatch Volume: Not applicable Infarction Location:Not applicable CTA NECK Skeleton: Chronic sinusitis. No acute osseous abnormality identified. Upper chest: Negative. No superior mediastinal lymphadenopathy. Other neck: Negative neck soft tissues. Aortic arch: 3 vessel arch configuration with no arch atherosclerosis. Right carotid system: No brachiocephalic or right CCA origin plaque or stenosis. Negative right carotid bifurcation. Negative cervical right ICA. Left carotid system: Negative. Vertebral arteries: Both proximal subclavian arteries and cervical vertebral arteries are negative. The left vertebral is mildly dominant. CTA HEAD Posterior circulation: Mildly dominant left vertebral artery, the right V4 segment decreases in size beyond the PICA. Normal PICA origins. No distal vertebral or vertebrobasilar junction stenosis. Patent basilar artery without stenosis. Patent SCA and PCA origins. Posterior communicating arteries are diminutive or absent. Bilateral PCA branches are within normal limits. Anterior circulation: Both ICA siphons are patent. Mild siphon calcified plaque which appears greater on the left, without hemodynamically significant stenosis. Patent carotid termini, MCA and ACA origins. Anterior communicating artery and bilateral ACA branches are within normal limits. Left MCA M1 and trifurcation are patent without stenosis. Left MCA branches are within normal limits. Right MCA M1 and bifurcation are patent without stenosis. Right MCA branches appear within normal limits. Venous sinuses: Patent. There is abnormal venous vascularity tracking from the right vein of Galen along the straight sinus and to the right tentorium and sigmoid sinus as seen on series 5, images 55 and 57. Arterial supply to this vascular malformation is unclear, perhaps the right PCA,  although that vessel is minimally enlarged if at all compared to the left PCA. However, there is mild asymmetry of the right MCA branches with respect to the right vein of lobe a (series 8 image 15), although it is not clear that this finding communicates with the other venous abnormality. Anatomic variants: None. Other findings: Delayed postcontrast images provided for further evaluation of the right tentorial abnormality. These redemonstrate abnormal vascularity in the a posterior medial right parieto-occipital sulcus and occiput in association with the abnormal venous vascularity described earlier. No other abnormal intracranial enhancement identified. Review of the MIP images confirms the above findings IMPRESSION: 1. Negative for large vessel occlusion and no infarct or ischemia detected by CTP. 2. Positive for abnormal venous vascularity in the posterior right hemisphere which accounts for the increased right tentorial density on the plain CT. 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 Neuro-endovascular follow-up is recommended. 3. No atherosclerosis or stenosis in the neck. Mild ICA siphon calcified plaque without stenosis. 4. 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 Preliminary sent via AMION to Dr. Beatrix FettersASHISH  ARORA on 02/20/2019 at 1636 hours. Final report communicated to Dr. Wilford Corner at 1655 hours by text page via the [facility] messaging system. Electronically Signed   By: Odessa Fleming M.D.   On: 02/20/2019 16:59   CT ANGIO NECK W OR WO CONTRAST  Result Date: 02/20/2019 CLINICAL DATA:  54 year old male code stroke presentation with left side weakness. IV tPA given. 8 mm nodular hyperdensity along the right tentorium on plain CT, favored to be small meningioma. EXAM: CT ANGIOGRAPHY HEAD AND NECK CT PERFUSION BRAIN TECHNIQUE: Multidetector CT imaging of the head and neck was performed using the standard protocol during bolus  administration of intravenous contrast. Multiplanar CT image reconstructions and MIPs were obtained to evaluate the vascular anatomy. Carotid stenosis measurements (when applicable) are obtained utilizing NASCET criteria, using the distal internal carotid diameter as the denominator. Multiphase CT imaging of the brain was performed following IV bolus contrast injection. Subsequent parametric perfusion maps were calculated using RAPID software. CONTRAST:  100 milliliters Omnipaque 350 COMPARISON:  Head CT without contrast 1609 hours today. FINDINGS: CT Brain Perfusion Findings: ASPECTS: 10 CBF (<30%) Volume: None Perfusion (Tmax>6.0s) volume: None Mismatch Volume: Not applicable Infarction Location:Not applicable CTA NECK Skeleton: Chronic sinusitis. No acute osseous abnormality identified. Upper chest: Negative. No superior mediastinal lymphadenopathy. Other neck: Negative neck soft tissues. Aortic arch: 3 vessel arch configuration with no arch atherosclerosis. Right carotid system: No brachiocephalic or right CCA origin plaque or stenosis. Negative right carotid bifurcation. Negative cervical right ICA. Left carotid system: Negative. Vertebral arteries: Both proximal subclavian arteries and cervical vertebral arteries are negative. The left vertebral is mildly dominant. CTA HEAD Posterior circulation: Mildly dominant left vertebral artery, the right V4 segment decreases in size beyond the PICA. Normal PICA origins. No distal vertebral or vertebrobasilar junction stenosis. Patent basilar artery without stenosis. Patent SCA and PCA origins. Posterior communicating arteries are diminutive or absent. Bilateral PCA branches are within normal limits. Anterior circulation: Both ICA siphons are patent. Mild siphon calcified plaque which appears greater on the left, without hemodynamically significant stenosis. Patent carotid termini, MCA and ACA origins. Anterior communicating artery and bilateral ACA branches are within  normal limits. Left MCA M1 and trifurcation are patent without stenosis. Left MCA branches are within normal limits. Right MCA M1 and bifurcation are patent without stenosis. Right MCA branches appear within normal limits. Venous sinuses: Patent. There is abnormal venous vascularity tracking from the right vein of Galen along the straight sinus and to the right tentorium and sigmoid sinus as seen on series 5, images 55 and 57. Arterial supply to this vascular malformation is unclear, perhaps the right PCA, although that vessel is minimally enlarged if at all compared to the left PCA. However, there is mild asymmetry of the right MCA branches with respect to the right vein of lobe a (series 8 image 15), although it is not clear that this finding communicates with the other venous abnormality. Anatomic variants: None. Other findings: Delayed postcontrast images provided for further evaluation of the right tentorial abnormality. These redemonstrate abnormal vascularity in the a posterior medial right parieto-occipital sulcus and occiput in association with the abnormal venous vascularity described earlier. No other abnormal intracranial enhancement identified. Review of the MIP images confirms the above findings IMPRESSION: 1. Negative for large vessel occlusion and no infarct or ischemia detected by CTP. 2. Positive for abnormal venous vascularity in the posterior right hemisphere which accounts for the increased right tentorial density on the plain CT. This seems to be a  high-flow vascular malformation, although no clear arterial supply is identified (perhaps the right MCA). Neuro-endovascular follow-up is recommended. 3. No atherosclerosis or stenosis in the neck. Mild ICA siphon calcified plaque without stenosis. 4. 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 Preliminary sent via AMION to Dr. Milon Dikes on 02/20/2019 at 1636 hours. Final report communicated to Dr. Wilford Corner at 1655 hours by text page via the  [facility] messaging system. Electronically Signed   By: Odessa Fleming M.D.   On: 02/20/2019 16:59   MR BRAIN W WO CONTRAST  Result Date: 02/21/2019 CLINICAL DATA:  Stroke follow-up. Left-sided weakness. Patient received tPA. AVM on CTA. EXAM: MRI HEAD WITHOUT AND WITH CONTRAST TECHNIQUE: Multiplanar, multiecho pulse sequences of the brain and surrounding structures were obtained without and with intravenous contrast. CONTRAST:  10mL GADAVIST GADOBUTROL 1 MMOL/ML IV SOLN COMPARISON:  Head CT, CTA, and CTP 02/20/2019 FINDINGS: Brain: There is a small acute infarct in the posterior right frontal lobe with involvement of the precentral gyrus. Small foci of T2 hyperintensity elsewhere in the cerebral white matter bilaterally are nonspecific but compatible with minimal chronic small vessel ischemic disease. A chronic lacunar infarct versus dilated perivascular space is noted in the right external capsule. No intracranial hemorrhage, mass, midline shift, or extra-axial fluid collection is identified. The ventricles and sulci are within normal limits for age. Vascular: As seen on the prior CTA, there are abnormally dilated veins in the right temporo-occipital region which track along the tentorium and drain into the right transverse sinus and vein of Galen. There appears to be a 1 cm nidus laterally. The dural venous sinuses enhance normally. Skull and upper cervical spine: Unremarkable bone marrow signal. Sinuses/Orbits: Unremarkable orbits. Moderately extensive mucosal thickening in the paranasal sinuses bilaterally. Clear mastoid air cells. Other: None. IMPRESSION: 1. Small acute posterior right frontal lobe infarct. 2. Vascular malformation along the right tentorium. Electronically Signed   By: Sebastian Ache M.D.   On: 02/21/2019 14:51   CT CEREBRAL PERFUSION W CONTRAST  Result Date: 02/20/2019 CLINICAL DATA:  54 year old male code stroke presentation with left side weakness. IV tPA given. 8 mm nodular hyperdensity along  the right tentorium on plain CT, favored to be small meningioma. EXAM: CT ANGIOGRAPHY HEAD AND NECK CT PERFUSION BRAIN TECHNIQUE: Multidetector CT imaging of the head and neck was performed using the standard protocol during bolus administration of intravenous contrast. Multiplanar CT image reconstructions and MIPs were obtained to evaluate the vascular anatomy. Carotid stenosis measurements (when applicable) are obtained utilizing NASCET criteria, using the distal internal carotid diameter as the denominator. Multiphase CT imaging of the brain was performed following IV bolus contrast injection. Subsequent parametric perfusion maps were calculated using RAPID software. CONTRAST:  100 milliliters Omnipaque 350 COMPARISON:  Head CT without contrast 1609 hours today. FINDINGS: CT Brain Perfusion Findings: ASPECTS: 10 CBF (<30%) Volume: None Perfusion (Tmax>6.0s) volume: None Mismatch Volume: Not applicable Infarction Location:Not applicable CTA NECK Skeleton: Chronic sinusitis. No acute osseous abnormality identified. Upper chest: Negative. No superior mediastinal lymphadenopathy. Other neck: Negative neck soft tissues. Aortic arch: 3 vessel arch configuration with no arch atherosclerosis. Right carotid system: No brachiocephalic or right CCA origin plaque or stenosis. Negative right carotid bifurcation. Negative cervical right ICA. Left carotid system: Negative. Vertebral arteries: Both proximal subclavian arteries and cervical vertebral arteries are negative. The left vertebral is mildly dominant. CTA HEAD Posterior circulation: Mildly dominant left vertebral artery, the right V4 segment decreases in size beyond the PICA. Normal PICA origins. No distal vertebral  or vertebrobasilar junction stenosis. Patent basilar artery without stenosis. Patent SCA and PCA origins. Posterior communicating arteries are diminutive or absent. Bilateral PCA branches are within normal limits. Anterior circulation: Both ICA siphons are  patent. Mild siphon calcified plaque which appears greater on the left, without hemodynamically significant stenosis. Patent carotid termini, MCA and ACA origins. Anterior communicating artery and bilateral ACA branches are within normal limits. Left MCA M1 and trifurcation are patent without stenosis. Left MCA branches are within normal limits. Right MCA M1 and bifurcation are patent without stenosis. Right MCA branches appear within normal limits. Venous sinuses: Patent. There is abnormal venous vascularity tracking from the right vein of Galen along the straight sinus and to the right tentorium and sigmoid sinus as seen on series 5, images 55 and 57. Arterial supply to this vascular malformation is unclear, perhaps the right PCA, although that vessel is minimally enlarged if at all compared to the left PCA. However, there is mild asymmetry of the right MCA branches with respect to the right vein of lobe a (series 8 image 15), although it is not clear that this finding communicates with the other venous abnormality. Anatomic variants: None. Other findings: Delayed postcontrast images provided for further evaluation of the right tentorial abnormality. These redemonstrate abnormal vascularity in the a posterior medial right parieto-occipital sulcus and occiput in association with the abnormal venous vascularity described earlier. No other abnormal intracranial enhancement identified. Review of the MIP images confirms the above findings IMPRESSION: 1. Negative for large vessel occlusion and no infarct or ischemia detected by CTP. 2. Positive for abnormal venous vascularity in the posterior right hemisphere which accounts for the increased right tentorial density on the plain CT. 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 Neuro-endovascular follow-up is recommended. 3. No atherosclerosis or stenosis in the neck. Mild ICA siphon calcified  plaque without stenosis. 4. 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 Preliminary sent via AMION to Dr. Milon Dikes on 02/20/2019 at 1636 hours. Final report communicated to Dr. Wilford Corner at 1655 hours by text page via the [facility] messaging system. Electronically Signed   By: Odessa Fleming M.D.   On: 02/20/2019 16:59   ECHOCARDIOGRAM COMPLETE  Result Date: 02/21/2019   ECHOCARDIOGRAM REPORT   Patient Name:   Stephen Terry Date of Exam: 02/21/2019 Medical Rec #:  [id number]   Height:       75.0 in Accession #:    [id number]  Weight:       235.9 lb Date of Birth:  30-Aug-1965   BSA:          2.35 m Patient Age:    53 years    BP:           140/79 mmHg Patient Gender: M           HR:           62 bpm. Exam Location:  Inpatient Procedure: 2D Echo, Cardiac Doppler and Color Doppler Indications:    Stroke 434.91 / I163.9  History:        Patient has no prior history of Echocardiogram examinations.                 Risk Factors:Hypertension.  Sonographer:    Elmarie Shiley Dance Referring Phys: [id number] ASHISH ARORA IMPRESSIONS  1. Left ventricular ejection fraction, by visual estimation, is 60 to 65%. The left ventricle has normal function. There is no left ventricular hypertrophy.  2. Left ventricular diastolic parameters are consistent with Grade I diastolic dysfunction (impaired relaxation).  3. The left ventricle has no regional wall motion abnormalities.  4. Global right ventricle has normal systolic function.The right ventricular size is normal.  5. Left atrial size was normal.  6. Right atrial size was normal.  7. The mitral valve is normal in structure. No evidence of mitral valve regurgitation. No evidence of mitral stenosis.  8. The tricuspid valve is normal in structure. Tricuspid valve regurgitation is trivial.  9. The aortic valve is tricuspid. Aortic valve regurgitation is not visualized. No evidence of aortic valve sclerosis or stenosis. 10. The pulmonic valve was normal in structure. Pulmonic valve regurgitation is not  visualized. 11. The inferior vena cava is normal in size with greater than 50% respiratory variability, suggesting right atrial pressure of 3 mmHg. 12. Normal LV systolic function; grade 1 diastolic dysfunction. FINDINGS  Left Ventricle: Left ventricular ejection fraction, by visual estimation, is 60 to 65%. The left ventricle has normal function. The left ventricle has no regional wall motion abnormalities. There is no left ventricular hypertrophy. Left ventricular diastolic parameters are consistent with Grade I diastolic dysfunction (impaired relaxation). Normal left atrial pressure. Right Ventricle: The right ventricular size is normal.Global RV systolic function is has normal systolic function. Left Atrium: Left atrial size was normal in size. Right Atrium: Right atrial size was normal in size Pericardium: There is no evidence of pericardial effusion. Mitral Valve: The mitral valve is normal in structure. No evidence of mitral valve regurgitation. No evidence of mitral valve stenosis by observation. Tricuspid Valve: The tricuspid valve is normal in structure. Tricuspid valve regurgitation is trivial. Aortic Valve: The aortic valve is tricuspid. Aortic valve regurgitation is not visualized. The aortic valve is structurally normal, with no evidence of sclerosis or stenosis. Pulmonic Valve: The pulmonic valve was normal in structure. Pulmonic valve regurgitation is not visualized. Pulmonic regurgitation is not visualized. Aorta: The aortic root is normal in size and structure. Venous: The inferior vena cava is normal in size with greater than 50% respiratory variability, suggesting right atrial pressure of 3 mmHg. IAS/Shunts: No atrial level shunt detected by color flow Doppler. Additional Comments: Normal LV systolic function; grade 1 diastolic dysfunction.  LEFT VENTRICLE PLAX 2D LVIDd:         4.80 cm  Diastology LVIDs:         3.10 cm  LV e' lateral:   8.16 cm/s LV PW:         1.00 cm  LV E/e' lateral: 7.4 LV  IVS:        1.10 cm  LV e' medial:    5.87 cm/s LVOT diam:     2.30 cm  LV E/e' medial:  10.3 LV SV:         70 ml LV SV Index:   29.01 LVOT Area:     4.15 cm  RIGHT VENTRICLE             IVC RV Basal diam:  2.40 cm     IVC diam: 1.50 cm RV S prime:     14.40 cm/s TAPSE (M-mode): 2.4 cm LEFT ATRIUM             Index       RIGHT ATRIUM           Index LA diam:        4.20 cm 1.78 cm/m  RA Area:     13.90 cm LA Vol (  A2C):   61.4 ml 26.08 ml/m RA Volume:   27.90 ml  11.85 ml/m LA Vol (A4C):   50.5 ml 21.45 ml/m LA Biplane Vol: 57.4 ml 24.39 ml/m  AORTIC VALVE LVOT Vmax:   102.00 cm/s LVOT Vmean:  67.000 cm/s LVOT VTI:    0.223 m  AORTA Ao Root diam: 3.60 cm Ao Asc diam:  2.80 cm MITRAL VALVE MV Area (PHT): 3.53 cm             SHUNTS MV PHT:        62.35 msec           Systemic VTI:  0.22 m MV Decel Time: 215 msec             Systemic Diam: 2.30 cm MV E velocity: 60.20 cm/s 103 cm/s MV A velocity: 81.20 cm/s 70.3 cm/s MV E/A ratio:  0.74       1.5  Olga Millers MD Electronically signed by Olga Millers MD Signature Date/Time: 02/21/2019/12:00:51 PM    Final    CT HEAD CODE STROKE WO CONTRAST  Result Date: 02/20/2019 CLINICAL DATA:  Code stroke.  54 year old male left side weakness. EXAM: CT HEAD WITHOUT CONTRAST TECHNIQUE: Contiguous axial images were obtained from the base of the skull through the vertex without intravenous contrast. COMPARISON:  None. FINDINGS: Brain: No midline shift, mass effect, or evidence of intracranial mass lesion. No ventriculomegaly. There is a small 7-8 millimeter area of nodular hyperdensity along the junction of the right transverse and sigmoid sinuses on coronal image 52. This does not strongly resemble an acute intracranial hemorrhage, might be a small meningioma, uncertain. No other intracranial blood identified. Small round hypodensity at the right external capsule on series 3, image 15 is age indeterminate. There is mild right frontal lobe subcortical white matter  hypodensity on series 3, image 20. No CT changes of acute cortically based infarct identified. No suspicious intracranial vascular hyperdensity. Vascular: Calcified atherosclerosis at the skull base. Skull: No acute osseous abnormality identified. Sinuses/Orbits: Pansinus inflammatory changes with periosteal thickening and probable polyposis. Tympanic cavities and mastoids clear. Other: Visualized orbit soft tissues are within normal limits. Small midline forehead scalp lipoma. No acute scalp soft tissue finding. ASPECTS [facility] Stroke Program Early CT Score) Total score (0-10 with 10 being normal): 10 IMPRESSION: 1. Indeterminate 8 mm nodular hyperdensity along the right lateral tentorium. But favor small meningioma rather than acute parenchymal hemorrhage. 2. There are small age indeterminate white matter changes in the right frontal lobe subcortical white matter. But no CT changes of acute cortically based infarct identified. Therefore ASPECTS 10. 3. 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 Salient findings were communicated to Dr. Wilford Corner at 4:16 pmon 02/20/2019 by text page via the [facility] messaging system. And also discussed by telephone with him at 16:18 . Electronically Signed   By: Odessa Fleming M.D.   On: 02/20/2019 16:19     HISTORY OF PRESENT ILLNESS Briana Newman is a 54 y.o. male no significant past medical history brought in for sudden onset of left-sided facial droop and slurred speech. According the patient he was sitting watching TV, drinking beer.  He drinks about 12 beers a day and he had only had 4 beers today.  He noted sudden onset of left-sided weakness, left facial droop and his speech sounded much slurred than what he would expect for after having had 4 beers. EMS was called, they also noted left-sided facial droop that was predominant, some left drift and brought him  as code stroke. Pt was  LKW at 1445 today 02/19/2010. Premorbid modified Rankin scale (mRS): 0.  tPA was given  and pt admitted to the neuro ICU.   HOSPITAL COURSE Mr. Jarious Lyon is a 54 y.o. male with no significant past medical history presenting with L facial droop and slurred speech. Received tPA 02/20/19 at 1613. Tolerated tPA without difficulties. No residual deficits. For d/c home on DAPT x 3 weeks then aspirin alone. Follow up with neurosurgeon for incidental AVM finding.    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 Meningioma. R frontal lobe white matter changes. ASPECTS 10. Sinus dz.   CTA head & neck posterior R tentorial AVM. Sinus dz.  CT perfusion no LVO or infarct  MRI w/w/o small right posterior frontal infarct. R tentorium AVM. 2D Echo normal ejection fraction 60 to 65%.  Mild diastolic dysfunction.  No clot. 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 Meningioma. R frontal lobe white matter changes. ASPECTS 10. Sinus dz.   CTA head & neck posterior R tentorial AVM. Sinus dz.  MRI w/w/o R tentorium AVM Follow up neurosurgery   Hyperlipidemia Home meds:  No statin LDL 201, goal < 70 Added lipitor 80   Continue statin at discharge   Other Stroke Risk Factors ETOH abuse, alcohol level 40, advised to drink no more than 2 drink(s) a day.  UDS neg Overweight, Body mass index is 29.48 kg/m., recommend weight loss, diet and exercise as appropriate    Other Active Problems Leukocytosis resolved. WBC 12.5->7.9. afebrile,    DISCHARGE EXAM Blood pressure 129/70, pulse 66, temperature 97.8 F (36.6 C), temperature source Oral,  resp. rate 16, height 6\' 3"  (1.905 m), weight 107 kg, SpO2 99 %. Pleasant middle-aged Caucasian male not in distress. . Afebrile. Head is nontraumatic. Neck is supple without bruit.    Cardiac exam no murmur or gallop. Lungs are clear to auscultation. Distal pulses are well felt. Neurological Exam :  Awake alert oriented to time place and person.  Mild dysarthria.  No aphasia.  Extraocular movements full range without nystagmus.  Left lower facial weakness.  Tongue midline.  Motor system exam no upper or lower extremity drift.  Mild weakness of left grip and intrinsic hand muscles.  Orbits right over left upper extremity.  Mild subjective diminished sensation in the left upper extremity but equal sensation in lower extremities.  Coordination is intact.  Gait not tested.  Discharge Diet   Heart healthy thin liquids  DISCHARGE PLAN Disposition:  Return home No therapy needs  Return to work 02/28/2019 aspirin 81 mg daily and clopidogrel 75 mg daily for secondary stroke prevention for 3 weeks then aspirin alone. Ongoing stroke risk factor control by Primary Care Physician at time of discharge Outpatient telemetry monitoring to look for atrial fibrillation as possible source of stroke Follow-up PCP in 2 weeks. Get one if you do not have. Follow-up in [facility] in 4 weeks, office to schedule an appointment.  Follow-up neurosurgeon Dr Conchita Paris to address AVM. As outpatient  32 minutes were spent preparing discharge.  Burnetta Sabin, MSN, APRN, ANVP-BC, AGPCNP-BC Advanced Practice Stroke Nurse [place] for Schedule & Pager information 02/22/2019 1:24 PM   I have personally obtained history,examined this patient, reviewed notes, independently viewed imaging studies, participated in medical decision making and plan of care.ROS completed by me personally and pertinent positives fully documented  I have made any additions or clarifications directly  to the above note. Agree with note above.    Antony Contras, MD Medical Director [place] Pager: [phone] 02/22/2019 2:08 PM

## 2019-02-22 NOTE — Telephone Encounter (Signed)
Patient enrolled for [employer] to ship a 30 day cardiac event monitor to his home.   

## 2019-03-02 ENCOUNTER — Ambulatory Visit (INDEPENDENT_AMBULATORY_CARE_PROVIDER_SITE_OTHER): Payer: BC Managed Care – PPO

## 2019-03-02 ENCOUNTER — Encounter: Payer: Self-pay | Admitting: Adult Health

## 2019-03-02 DIAGNOSIS — I639 Cerebral infarction, unspecified: Secondary | ICD-10-CM

## 2019-03-02 DIAGNOSIS — I4891 Unspecified atrial fibrillation: Secondary | ICD-10-CM

## 2019-03-29 ENCOUNTER — Ambulatory Visit (INDEPENDENT_AMBULATORY_CARE_PROVIDER_SITE_OTHER): Payer: BC Managed Care – PPO | Admitting: Adult Health

## 2019-03-29 ENCOUNTER — Other Ambulatory Visit: Payer: Self-pay

## 2019-03-29 ENCOUNTER — Encounter: Payer: Self-pay | Admitting: Adult Health

## 2019-03-29 VITALS — BP 125/73 | HR 60 | Temp 97.6°F | Ht 75.0 in | Wt 215.2 lb

## 2019-03-29 DIAGNOSIS — I639 Cerebral infarction, unspecified: Secondary | ICD-10-CM | POA: Diagnosis not present

## 2019-03-29 DIAGNOSIS — E782 Mixed hyperlipidemia: Secondary | ICD-10-CM

## 2019-03-29 DIAGNOSIS — Q282 Arteriovenous malformation of cerebral vessels: Secondary | ICD-10-CM | POA: Diagnosis not present

## 2019-03-29 NOTE — Progress Notes (Signed)
I agree with the above plan 

## 2019-03-29 NOTE — Progress Notes (Signed)
[facility] [address] [place]. [place] [address] [phone]       HOSPITAL FOLLOW UP NOTE  Mr. Stephen Terry Date of Birth:  10-04-1965 Medical Record Number:  [id number]   Reason for Referral:  hospital stroke follow up    CHIEF COMPLAINT:  Chief Complaint  Patient presents with  . Hospitalization Follow-up    Alone. Rm 9. No new concerns at this time.     HPI: Stephen Rueth Smithis being seen today for in office hospital follow-up regarding right MCA stroke.  History obtained from patient and chart review. Reviewed all radiology images and labs personally.  Stephen Smithis a 16 y.o.malewith no significant past medicalhistory presenting on 02/20/2019 withL facial droop and slurred speech.  Evaluated by stroke team and Dr. Leonie Man with stroke work-up revealing right posterior frontal (MCA) infarct status post TPA embolic secondary to unknown source.  Incidental AVM finding and advised to follow-up with neurosurgery outpatient.  Recommended cardiac event monitor outpatient to rule out atrial fibrillation.  Recommended DAPT for 3 weeks and aspirin alone.  LDL 201 initiate atorvastatin 80 mg daily.  No prior history of HTN or DM.  Other stroke risk factors include EtOH abuse, tobacco use and overweight but no prior history of stroke.  Residual deficits of mild dysarthria and mild left lower facial weakness and discharged home in stable condition without therapy needs.  Stephen Terry is a 54 year old male who is being seen today for hospital follow-up. He has been stable from a stroke standpoint with residual mild left facial droop. He does endorse occasional episodes of dizziness/lightheadedness that will only last for a couple minutes and then subside. He has returned back to work without difficulty.  Completed 3 weeks DAPT and continues on aspirin alone without bleeding or bruising.  Continues on atorvastatin without myalgias.  Blood pressure today 125/73. He plans on  returning cardiac monitor as tomorrow as recently completed. He has completely stopped ETOH use since discharge as well as improving diet. No further concerns at this time.       ROS:   14 system review of systems performed and negative with exception of dizziness  PMH:  Past Medical History:  Diagnosis Date  . Hypertension     PSH: History reviewed. No pertinent surgical history.  Social History:  Social History   Socioeconomic History  . Marital status: Legally Separated    Spouse name: Not on file  . Number of children: Not on file  . Years of education: Not on file  . Highest education level: Not on file  Occupational History  . Not on file  Tobacco Use  . Smoking status: Heavy Tobacco Smoker    Packs/day: 1.00    Types: Cigarettes  . Smokeless tobacco: Never Used  Substance and Sexual Activity  . Alcohol use: Yes    Alcohol/week: 10.0 - 12.0 standard drinks    Types: 10 - 12 Cans of beer per week  . Drug use: Not on file  . Sexual activity: Not on file  Other Topics Concern  . Not on file  Social History Narrative  . Not on file   Social Determinants of Health   Financial Resource Strain:   . Difficulty of Paying Living Expenses: Not on file  Food Insecurity:   . Worried About Charity fundraiser in the Last Year: Not on file  . Ran Out of Food in the Last Year: Not on file  Transportation Needs:   . Lack  of Transportation (Medical): Not on file  . Lack of Transportation (Non-Medical): Not on file  Physical Activity:   . Days of Exercise per Week: Not on file  . Minutes of Exercise per Session: Not on file  Stress:   . Feeling of Stress : Not on file  Social Connections:   . Frequency of Communication with Friends and Family: Not on file  . Frequency of Social Gatherings with Friends and Family: Not on file  . Attends Religious Services: Not on file  . Active Member of Clubs or Organizations: Not on file  . Attends Banker Meetings:  Not on file  . Marital Status: Not on file  Intimate Partner Violence:   . Fear of Current or Ex-Partner: Not on file  . Emotionally Abused: Not on file  . Physically Abused: Not on file  . Sexually Abused: Not on file    Family History: History reviewed. No pertinent family history.  Medications:   Current Outpatient Medications on File Prior to Visit  Medication Sig Dispense Refill  . aspirin EC 81 MG EC tablet Take 1 tablet (81 mg total) by mouth daily.    Marland Kitchen atorvastatin (LIPITOR) 80 MG tablet Take 1 tablet (80 mg total) by mouth daily at 6 PM. 30 tablet 2  . lamoTRIgine (LAMICTAL) 200 MG tablet Take 200 mg by mouth daily.    . propranolol (INDERAL) 20 MG tablet Take 20-40 mg by mouth 3 (three) times daily as needed (For tremers).    . sertraline (ZOLOFT) 100 MG tablet Take 150 mg by mouth daily.     No current facility-administered medications on file prior to visit.    Allergies:  No Known Allergies   Physical Exam  Vitals:   03/29/19 1450  BP: 125/73  Pulse: 60  Temp: 97.6 F (36.4 C)  TempSrc: Oral  Weight: 215 lb 3.2 oz (97.6 kg)  Height: 6\' 3"  (1.905 m)   Body mass index is 26.9 kg/m. No exam data present  No flowsheet data found.   General: well developed, well nourished,  pleasant middle-age Caucasian male, seated, in no evident distress Head: head normocephalic and atraumatic.   Neck: supple with no carotid or supraclavicular bruits Cardiovascular: regular rate and rhythm, no murmurs Musculoskeletal: no deformity Skin:  no rash/petichiae Vascular:  Normal pulses all extremities   Neurologic Exam Mental Status: Awake and fully alert.   Normal speech and language.  Oriented to place and time. Recent and remote memory intact. Attention span, concentration and fund of knowledge appropriate. Mood and affect appropriate.  Cranial Nerves: Fundoscopic exam reveals sharp disc margins. Pupils equal, briskly reactive to light. Extraocular movements full without  nystagmus. Visual fields full to confrontation. Hearing intact. Facial sensation intact.  Mild left lower facial weakness. Motor: Normal bulk and tone. Normal strength in all tested extremity muscles. Sensory.: intact to touch , pinprick , position and vibratory sensation.  Coordination: Rapid alternating movements normal in all extremities. Finger-to-nose and heel-to-shin performed accurately bilaterally. Gait and Station: Arises from chair without difficulty. Stance is normal. Gait demonstrates normal stride length and balance Reflexes: 1+ and symmetric. Toes downgoing.     NIHSS  0 Modified Rankin  1    Diagnostic Data (Labs, Imaging, Testing)   Code Stroke CT headR lateral tentorium hyperdensity ? Meningioma. R frontal lobe white matter changes.ASPECTS 10.Sinus dz.  CTA head & neckposterior R tentorial AVM. Sinus dz.  CT perfusionno LVO or infarct  MRIw/w/osmall right posterior frontal infarct. R  tentorium AVM.  2D Echonormal ejection fraction 60 to 65%. Mild diastolic dysfunction. No clot.  [id number]  HgbA1c5.1   ASSESSMENT: Stephen Terry is a 72 y.o. year old male presented with left facial droop and slurred speech on 02/20/2019 with stroke work-up revealing right posterior frontal infarct status post TPA embolic secondary to unknown source.  Recommended cardiac event monitor outpatient recently completed and will be sending back to cardiology tomorrow.  Vascular risk factors include incidental finding of likely dural AVM, HLD, EtOH use and tobacco use.  Recovered well from a stroke standpoint with residual mild left lower facial weakness    PLAN:  1. Right MCA stroke: Continue aspirin 81 mg daily  and atorvastatin for secondary stroke prevention.  Advised to send back cardiac event monitor to cardiology tomorrow to rule out atrial fibrillation as potential cause of stroke.  Maintain strict control of hypertension with blood pressure goal below 130/90, diabetes  with hemoglobin A1c goal below 6.5% and cholesterol with LDL cholesterol (bad cholesterol) goal below 70 mg/dL.  I also advised the patient to eat a healthy diet with plenty of whole grains, cereals, fruits and vegetables, exercise regularly with at least 30 minutes of continuous activity daily and maintain ideal body weight. 2. HLD: Advised to continue current treatment regimen along with continued follow-up with PCP for future prescribing and monitoring of lipid panel 3. 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  Referral placed for evaluation by Dr. Conchita Paris as recommended by Dr. Pearlean Brownie    Follow up in 4 months or call earlier if needed   Greater than 50% of time during this 45 minute visit was spent on counseling, explanation of diagnosis of right MCA stroke, reviewing risk factor management of HLD and likely dural AVM, planning of further management along with potential future management, and discussion with patient and family answering all questions.    Ihor Austin, AGNP-BC  [facility] [address] [place], [place] [address]  Phone [phone] Fax [phone] Note: This document was prepared with digital dictation and possible smart phrase technology. Any transcriptional errors that result from this process are unintentional.

## 2019-03-29 NOTE — Patient Instructions (Signed)
Continue aspirin 81 mg daily  and lipitor  for secondary stroke prevention  Continue to follow up with PCP regarding cholesterol management   Referral placed to neurosurgery for evaluation regarding AVM - you will be called to schedule visit  Continue to monitor blood pressure at home  Maintain strict control of hypertension with blood pressure goal below 130/90, diabetes with hemoglobin A1c goal below 6.5% and cholesterol with LDL cholesterol (bad cholesterol) goal below 70 mg/dL. I also advised the patient to eat a healthy diet with plenty of whole grains, cereals, fruits and vegetables, exercise regularly and maintain ideal body weight.  Followup in the future with me in 4 months or call earlier if needed       Thank you for coming to see [place] at [facility]. I hope we have been able to provide you high quality care today.  You may receive a patient satisfaction survey over the next few weeks. We would appreciate your feedback and comments so that we may continue to improve ourselves and the health of our patients.

## 2019-04-14 ENCOUNTER — Other Ambulatory Visit: Payer: Self-pay | Admitting: Neurosurgery

## 2019-04-14 DIAGNOSIS — I671 Cerebral aneurysm, nonruptured: Secondary | ICD-10-CM

## 2019-04-21 ENCOUNTER — Other Ambulatory Visit: Payer: Self-pay | Admitting: Medical

## 2019-04-21 ENCOUNTER — Telehealth: Payer: Self-pay

## 2019-04-21 DIAGNOSIS — I4891 Unspecified atrial fibrillation: Secondary | ICD-10-CM

## 2019-04-21 DIAGNOSIS — I639 Cerebral infarction, unspecified: Secondary | ICD-10-CM

## 2019-04-21 NOTE — Telephone Encounter (Signed)
Dr. Pearlean Brownie, Prior cryptogenic stroke with negative 30-day cardiac event monitor.  Do you recommend proceeding with loop recorder placement for long-term monitoring of possible atrial fibrillation? Jess

## 2019-04-21 NOTE — Telephone Encounter (Signed)
End of study summary received on Pt.  Study not yet downloaded for review.  Per summary report NO afib noted on monitor.  Will forward to ordering neurology for further advisement/referral to cardiology if advised.

## 2019-04-26 NOTE — Progress Notes (Signed)
Kindly inform the patient that cardiac event monitor study did not reveal evidence of atrial fibrillation.

## 2019-04-26 NOTE — Telephone Encounter (Signed)
Yes and also consider [place] trial

## 2019-04-27 ENCOUNTER — Telehealth: Payer: Self-pay | Admitting: *Deleted

## 2019-04-27 NOTE — Telephone Encounter (Signed)
Spoke with patient and informed him that cardiac event monitor study did not reveal evidence of atrial fibrillation. Patient verbalized understanding, appreciation.

## 2019-04-29 ENCOUNTER — Other Ambulatory Visit (HOSPITAL_COMMUNITY)
Admission: RE | Admit: 2019-04-29 | Discharge: 2019-04-29 | Disposition: A | Discharge status: Home / Self Care | Payer: BC Managed Care – PPO | Source: Ambulatory Visit | Attending: Neurosurgery | Admitting: Neurosurgery

## 2019-04-29 DIAGNOSIS — Z01812 Encounter for preprocedural laboratory examination: Secondary | ICD-10-CM | POA: Insufficient documentation

## 2019-04-29 DIAGNOSIS — Z20822 Contact with and (suspected) exposure to covid-19: Secondary | ICD-10-CM | POA: Insufficient documentation

## 2019-04-29 LAB — SARS CORONAVIRUS 2 (TAT 6-24 HRS): SARS Coronavirus 2: NEGATIVE

## 2019-05-02 ENCOUNTER — Telehealth: Payer: Self-pay | Admitting: Adult Health

## 2019-05-02 NOTE — Telephone Encounter (Signed)
Call patient in regards to recommendation of proceeding with ILR placement.  30-day cardiac event monitor negative for atrial fibrillation and it was recommended to proceed with placement of loop recorder.  Additional information provided to patient via telephone as well as providing a link to [email or web address] for additional information.  He does plan on considering and will review additional information and call office once he wishes to proceed.

## 2019-05-02 NOTE — H&P (Signed)
Chief Complaint   AV dural fistula  HPI   HPI: Stephen Terry is a 54 y.o. 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  He presents today for diagnostic cerebral angiogram for further characterization.  He is without any concerns.  Patient Active Problem List   Diagnosis Date Noted  . AVM (arteriovenous malformation) brain 02/22/2019  . Hyperlipidemia 02/22/2019  . ETOH use 02/22/2019  . Overweight 02/22/2019  . 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 Hypertension     PSH: No past surgical history on file.  (Not in a hospital admission)   SH: Social History   Tobacco Use  . Smoking status: Heavy Tobacco Smoker    Packs/day: 1.00    Types: Cigarettes  . Smokeless tobacco: Never Used  Substance Use Topics  . Alcohol use: Yes    Alcohol/week: 10.0 - 12.0 standard drinks    Types: 10 - 12 Cans of beer per week  . Drug use: Not on file    MEDS: Prior to Admission medications   Medication Sig Start Date End Date Taking? Authorizing Provider  aspirin EC 81 MG EC tablet Take 1 tablet (81 mg total) by mouth daily. 02/22/19   Layne Benton, NP  atorvastatin (LIPITOR) 80 MG tablet Take 1 tablet (80 mg total) by mouth daily at 6 PM. 02/22/19   Layne Benton, NP  lamoTRIgine (LAMICTAL) 200 MG tablet Take 200 mg by mouth daily.    [provider]  propranolol (INDERAL) 20 MG tablet Take 20-40 mg by mouth 3 (three) times daily as needed (For tremers).    [provider]  sertraline (ZOLOFT) 100 MG tablet Take 150 mg by mouth daily.    [provider]    ALLERGY: No Known Allergies  Social History   Tobacco Use  . Smoking status: Heavy Tobacco Smoker    Packs/day: 1.00    Types: Cigarettes  . Smokeless tobacco: Never Used  Substance Use Topics    . Alcohol use: Yes    Alcohol/week: 10.0 - 12.0 standard drinks    Types: 10 - 12 Cans of beer per week     No family history on file.   ROS   ROS  Exam   There were no vitals filed for this visit. General appearance: WDWN, NAD Eyes: No scleral injection Cardiovascular: Regular rate and rhythm without murmurs, rubs, gallops. No edema or variciosities. Distal pulses normal. Pulmonary: Effort normal, non-labored breathing Musculoskeletal:     Muscle tone upper extremities: Normal    Muscle tone lower extremities: Normal    Motor exam: Upper Extremities Deltoid Bicep Tricep Grip  Right 5/5 5/5 5/5 5/5  Left 5/5 5/5 5/5 5/5   Lower Extremity IP Quad PF DF EHL  Right 5/5 5/5 5/5 5/5 5/5  Left 5/5 5/5 5/5 5/5 5/5   Neurological Mental Status:    - Patient is awake, alert, oriented to person, place, month, year, and situation    - Patient is able to give a clear and coherent history.    - No signs of aphasia or neglect Cranial Nerves    - II: Visual Fields are full. PERRL    - III/IV/VI: EOMI without ptosis or diploplia.     - V: Facial sensation is grossly normal    -  VII: Facial movement is symmetric.     - VIII: hearing is intact to voice    - X: Uvula elevates symmetrically    - XI: Shoulder shrug is symmetric.    - XII: tongue is midline without atrophy or fasciculations.  Sensory: Sensation grossly intact to LT  Results - Imaging/Labs   No results found for this or any previous visit (from the past 48 hour(s)).  No results found.  IMAGING: MRI of the brain dated 02/21/2019 was personally reviewed.  In addition to acute stroke noted in the right precentral gyrus, there is significantly dilated vein along the right tentorium highly suggestive of dural AV fistula.  CT angiogram dated 02/20/2019 was also personally reviewed.  This again does demonstrate significantly dilated vessels along the right tentorium.  There does appear to be a venous varix at the level of  the right transverse sigmoid junction.  There is also significant contrast opacification of the transverse and sigmoid sinuses as well as the jugular veins bilaterally despite adequately timed contrast bolus for the CT angiogram suggesting venous shunting.  Impression/Plan   54 y.o. 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 we will proceed with diagnostic cerebral angiogram for further characterization of the dural AV fistula  While in the office risks, benefits and alternatives were discussed.  Patient stated understanding and wished to proceed.

## 2019-05-03 ENCOUNTER — Other Ambulatory Visit: Payer: Self-pay

## 2019-05-03 ENCOUNTER — Ambulatory Visit (HOSPITAL_COMMUNITY)
Admission: RE | Admit: 2019-05-03 | Discharge: 2019-05-03 | Disposition: A | Discharge status: Home / Self Care | Payer: BC Managed Care – PPO | Source: Ambulatory Visit | Attending: Neurosurgery | Admitting: Neurosurgery

## 2019-05-03 DIAGNOSIS — F17210 Nicotine dependence, cigarettes, uncomplicated: Secondary | ICD-10-CM | POA: Insufficient documentation

## 2019-05-03 DIAGNOSIS — Z7982 Long term (current) use of aspirin: Secondary | ICD-10-CM | POA: Insufficient documentation

## 2019-05-03 DIAGNOSIS — E785 Hyperlipidemia, unspecified: Secondary | ICD-10-CM | POA: Diagnosis not present

## 2019-05-03 DIAGNOSIS — Z79899 Other long term (current) drug therapy: Secondary | ICD-10-CM | POA: Diagnosis not present

## 2019-05-03 DIAGNOSIS — Z8673 Personal history of transient ischemic attack (TIA), and cerebral infarction without residual deficits: Secondary | ICD-10-CM | POA: Diagnosis not present

## 2019-05-03 DIAGNOSIS — I10 Essential (primary) hypertension: Secondary | ICD-10-CM | POA: Insufficient documentation

## 2019-05-03 DIAGNOSIS — I671 Cerebral aneurysm, nonruptured: Secondary | ICD-10-CM | POA: Insufficient documentation

## 2019-05-03 LAB — BASIC METABOLIC PANEL
Anion gap: 9 (ref 5–15)
BUN: 9 mg/dL (ref 6–20)
CO2: 24 mmol/L (ref 22–32)
Calcium: 8.5 mg/dL — ABNORMAL LOW (ref 8.9–10.3)
Chloride: 107 mmol/L (ref 98–111)
Creatinine, Ser: 0.95 mg/dL (ref 0.61–1.24)
GFR calc Af Amer: >60 mL/min (ref >60)
GFR calc non Af Amer: >60 mL/min (ref >60)
Glucose, Bld: 112 mg/dL — ABNORMAL HIGH (ref 70–99)
Potassium: 3.8 mmol/L (ref 3.5–5.1)
Sodium: 140 mmol/L (ref 135–145)

## 2019-05-03 LAB — CBC WITH DIFFERENTIAL/PLATELET
Abs Immature Granulocytes: 0.06 10*3/uL (ref 0.00–0.07)
Basophils Absolute: 0.1 10*3/uL (ref 0.0–0.1)
Basophils Relative: 1 %
Eosinophils Absolute: 0.5 10*3/uL (ref 0.0–0.5)
Eosinophils Relative: 6 %
HCT: 46.2 % (ref 39.0–52.0)
Hemoglobin: 15.3 g/dL (ref 13.0–17.0)
Immature Granulocytes: 1 %
Lymphocytes Relative: 25 %
Lymphs Abs: 2.3 10*3/uL (ref 0.7–4.0)
MCH: 31.4 pg (ref 26.0–34.0)
MCHC: 33.1 g/dL (ref 30.0–36.0)
MCV: 94.7 fL (ref 80.0–100.0)
Monocytes Absolute: 0.7 10*3/uL (ref 0.1–1.0)
Monocytes Relative: 8 %
Neutro Abs: 5.6 10*3/uL (ref 1.7–7.7)
Neutrophils Relative %: 59 %
Platelets: 226 10*3/uL (ref 150–400)
RBC: 4.88 MIL/uL (ref 4.22–5.81)
RDW: 13.6 % (ref 11.5–15.5)
WBC: 9.2 10*3/uL (ref 4.0–10.5)
nRBC: 0 % (ref 0.0–0.2)

## 2019-05-03 LAB — PROTIME-INR
INR: 0.9 (ref 0.8–1.2)
Prothrombin Time: 12.4 seconds (ref 11.4–15.2)

## 2019-05-03 LAB — APTT: aPTT: 29 seconds (ref 24–36)

## 2019-05-03 MED ORDER — CEFAZOLIN SODIUM-DEXTROSE 2-4 GM/100ML-% IV SOLN: 2.0000 g | INTRAVENOUS | Status: DC

## 2019-05-03 MED ORDER — CHLORHEXIDINE GLUCONATE CLOTH 2 % EX PADS: 6.0000 | MEDICATED_PAD | Freq: Once | CUTANEOUS | Status: DC

## 2019-05-04 ENCOUNTER — Other Ambulatory Visit: Payer: Self-pay | Admitting: Neurosurgery

## 2019-05-04 ENCOUNTER — Other Ambulatory Visit: Payer: Self-pay | Admitting: Adult Health

## 2019-05-04 DIAGNOSIS — I639 Cerebral infarction, unspecified: Secondary | ICD-10-CM

## 2019-05-04 DIAGNOSIS — I671 Cerebral aneurysm, nonruptured: Secondary | ICD-10-CM

## 2019-05-04 NOTE — Progress Notes (Signed)
Previously placed order but canceled as patient was unsure if he wanted to proceed with loop recorder placement.  Call patient and further discussed indication for loop recorder and provided additional information for his personal review.  He has decided at this time to proceed with loop recorder placement.

## 2019-05-05 ENCOUNTER — Other Ambulatory Visit: Payer: Self-pay | Admitting: Neurosurgery

## 2019-05-13 ENCOUNTER — Other Ambulatory Visit (HOSPITAL_COMMUNITY): Payer: BC Managed Care – PPO

## 2019-05-17 ENCOUNTER — Other Ambulatory Visit (HOSPITAL_COMMUNITY)
Admission: RE | Admit: 2019-05-17 | Discharge: 2019-05-17 | Disposition: A | Discharge status: Home / Self Care | Payer: BC Managed Care – PPO | Source: Ambulatory Visit | Attending: Neurosurgery | Admitting: Neurosurgery

## 2019-05-17 ENCOUNTER — Other Ambulatory Visit (HOSPITAL_COMMUNITY): Payer: BC Managed Care – PPO

## 2019-05-17 DIAGNOSIS — Z20822 Contact with and (suspected) exposure to covid-19: Secondary | ICD-10-CM | POA: Diagnosis not present

## 2019-05-17 DIAGNOSIS — Z01812 Encounter for preprocedural laboratory examination: Secondary | ICD-10-CM | POA: Diagnosis not present

## 2019-05-17 LAB — SARS CORONAVIRUS 2 (TAT 6-24 HRS): SARS Coronavirus 2: NEGATIVE

## 2019-05-20 ENCOUNTER — Other Ambulatory Visit: Payer: Self-pay | Admitting: Neurosurgery

## 2019-05-20 ENCOUNTER — Other Ambulatory Visit: Payer: Self-pay

## 2019-05-20 ENCOUNTER — Encounter (HOSPITAL_COMMUNITY): Payer: Self-pay

## 2019-05-20 ENCOUNTER — Ambulatory Visit (HOSPITAL_COMMUNITY)
Admission: RE | Admit: 2019-05-20 | Discharge: 2019-05-20 | Disposition: A | Discharge status: Home / Self Care | Payer: BC Managed Care – PPO | Source: Ambulatory Visit | Attending: Neurosurgery | Admitting: Neurosurgery

## 2019-05-20 DIAGNOSIS — Z8673 Personal history of transient ischemic attack (TIA), and cerebral infarction without residual deficits: Secondary | ICD-10-CM | POA: Diagnosis not present

## 2019-05-20 DIAGNOSIS — Z79899 Other long term (current) drug therapy: Secondary | ICD-10-CM | POA: Diagnosis not present

## 2019-05-20 DIAGNOSIS — I10 Essential (primary) hypertension: Secondary | ICD-10-CM | POA: Diagnosis not present

## 2019-05-20 DIAGNOSIS — F17210 Nicotine dependence, cigarettes, uncomplicated: Secondary | ICD-10-CM | POA: Insufficient documentation

## 2019-05-20 DIAGNOSIS — I671 Cerebral aneurysm, nonruptured: Secondary | ICD-10-CM | POA: Diagnosis present

## 2019-05-20 DIAGNOSIS — Z7982 Long term (current) use of aspirin: Secondary | ICD-10-CM | POA: Insufficient documentation

## 2019-05-20 DIAGNOSIS — I770 Arteriovenous fistula, acquired: Secondary | ICD-10-CM | POA: Insufficient documentation

## 2019-05-20 DIAGNOSIS — E785 Hyperlipidemia, unspecified: Secondary | ICD-10-CM | POA: Diagnosis not present

## 2019-05-20 HISTORY — PX: IR ANGIO EXTERNAL CAROTID SEL EXT CAROTID UNI L MOD SED: IMG5370

## 2019-05-20 HISTORY — PX: IR ANGIO EXTERNAL CAROTID SEL EXT CAROTID UNI R MOD SED: IMG5371

## 2019-05-20 HISTORY — PX: IR ANGIO INTRA EXTRACRAN SEL INTERNAL CAROTID BILAT MOD SED: IMG5363

## 2019-05-20 HISTORY — PX: IR ANGIO VERTEBRAL SEL VERTEBRAL BILAT MOD SED: IMG5369

## 2019-05-20 LAB — CBC WITH DIFFERENTIAL/PLATELET
Abs Immature Granulocytes: 0.06 10*3/uL (ref 0.00–0.07)
Basophils Absolute: 0.1 10*3/uL (ref 0.0–0.1)
Basophils Relative: 1 %
Eosinophils Absolute: 0.9 10*3/uL — ABNORMAL HIGH (ref 0.0–0.5)
Eosinophils Relative: 9 %
HCT: 46.4 % (ref 39.0–52.0)
Hemoglobin: 15.5 g/dL (ref 13.0–17.0)
Immature Granulocytes: 1 %
Lymphocytes Relative: 28 %
Lymphs Abs: 2.9 10*3/uL (ref 0.7–4.0)
MCH: 31.6 pg (ref 26.0–34.0)
MCHC: 33.4 g/dL (ref 30.0–36.0)
MCV: 94.7 fL (ref 80.0–100.0)
Monocytes Absolute: 0.8 10*3/uL (ref 0.1–1.0)
Monocytes Relative: 8 %
Neutro Abs: 5.7 10*3/uL (ref 1.7–7.7)
Neutrophils Relative %: 53 %
Platelets: 241 10*3/uL (ref 150–400)
RBC: 4.9 MIL/uL (ref 4.22–5.81)
RDW: 13.5 % (ref 11.5–15.5)
WBC: 10.5 10*3/uL (ref 4.0–10.5)
nRBC: 0 % (ref 0.0–0.2)

## 2019-05-20 LAB — BASIC METABOLIC PANEL
Anion gap: 10 (ref 5–15)
BUN: 10 mg/dL (ref 6–20)
CO2: 22 mmol/L (ref 22–32)
Calcium: 8.3 mg/dL — ABNORMAL LOW (ref 8.9–10.3)
Chloride: 109 mmol/L (ref 98–111)
Creatinine, Ser: 1.01 mg/dL (ref 0.61–1.24)
GFR calc Af Amer: >60 mL/min (ref >60)
GFR calc non Af Amer: >60 mL/min (ref >60)
Glucose, Bld: 104 mg/dL — ABNORMAL HIGH (ref 70–99)
Potassium: 3.8 mmol/L (ref 3.5–5.1)
Sodium: 141 mmol/L (ref 135–145)

## 2019-05-20 LAB — PROTIME-INR
INR: 0.9 (ref 0.8–1.2)
Prothrombin Time: 11.8 seconds (ref 11.4–15.2)

## 2019-05-20 LAB — APTT: aPTT: 27 seconds (ref 24–36)

## 2019-05-20 MED ORDER — SODIUM CHLORIDE 0.9 % IV SOLN: INTRAVENOUS | Status: DC

## 2019-05-20 MED ORDER — MIDAZOLAM HCL 2 MG/2ML IJ SOLN
INTRAMUSCULAR | Status: AC
  Filled 2019-05-20: qty 2

## 2019-05-20 MED ORDER — MIDAZOLAM HCL 2 MG/2ML IJ SOLN
INTRAMUSCULAR | Status: AC | PRN
  Administered 2019-05-20: 1 mg via INTRAVENOUS

## 2019-05-20 MED ORDER — LIDOCAINE HCL 1 % IJ SOLN
INTRAMUSCULAR | Status: AC
  Filled 2019-05-20: qty 20

## 2019-05-20 MED ORDER — LIDOCAINE HCL 1 % IJ SOLN
INTRAMUSCULAR | Status: AC | PRN
  Administered 2019-05-20: 10 mL

## 2019-05-20 MED ORDER — FENTANYL CITRATE (PF) 100 MCG/2ML IJ SOLN
INTRAMUSCULAR | Status: AC
  Filled 2019-05-20: qty 2

## 2019-05-20 MED ORDER — HEPARIN SODIUM (PORCINE) 1000 UNIT/ML IJ SOLN
INTRAMUSCULAR | Status: AC | PRN
  Administered 2019-05-20: 2000 [IU] via INTRAVENOUS

## 2019-05-20 MED ORDER — HEPARIN SODIUM (PORCINE) 1000 UNIT/ML IJ SOLN
INTRAMUSCULAR | Status: AC
  Filled 2019-05-20: qty 1

## 2019-05-20 MED ORDER — FENTANYL CITRATE (PF) 100 MCG/2ML IJ SOLN
INTRAMUSCULAR | Status: AC | PRN
  Administered 2019-05-20: 25 ug via INTRAVENOUS

## 2019-05-20 MED ORDER — HYDROCODONE-ACETAMINOPHEN 5-325 MG PO TABS: 1.0000 | ORAL_TABLET | ORAL | Status: DC | PRN

## 2019-05-20 MED ORDER — IOHEXOL 300 MG/ML  SOLN
50.0000 mL | Freq: Once | INTRAMUSCULAR | Status: AC | PRN
  Administered 2019-05-20: 10 mL via INTRA_ARTERIAL

## 2019-05-20 MED ORDER — IOHEXOL 300 MG/ML  SOLN
100.0000 mL | Freq: Once | INTRAMUSCULAR | Status: AC | PRN
  Administered 2019-05-20: 50 mL via INTRA_ARTERIAL

## 2019-05-20 NOTE — Discharge Instructions (Addendum)
Femoral Site Care This sheet gives you information about how to care for yourself after your procedure. Your health care provider may also give you more specific instructions. If you have problems or questions, contact your health care provider. What can I expect after the procedure? After the procedure, it is common to have:  Bruising that usually fades within 1-2 weeks.  Tenderness at the site. Follow these instructions at home: Wound care  Follow instructions from your health care provider about how to take care of your insertion site. Make sure you: ? Wash your hands with soap and water before you change your bandage (dressing). If soap and water are not available, use hand sanitizer. ? Change your dressing as told by your health care provider. ? Leave stitches (sutures), skin glue, or adhesive strips in place. These skin closures may need to stay in place for 2 weeks or longer. If adhesive strip edges start to loosen and curl up, you may trim the loose edges. Do not remove adhesive strips completely unless your health care provider tells you to do that.  Do not take baths, swim, or use a hot tub until your health care provider approves.  You may shower 24-48 hours after the procedure or as told by your health care provider. ? Gently wash the site with plain soap and water. ? Pat the area dry with a clean towel. ? Do not rub the site. This may cause bleeding.  Do not apply powder or lotion to the site. Keep the site clean and dry.  Check your femoral site every day for signs of infection. Check for: ? Redness, swelling, or pain. ? Fluid or blood. ? Warmth. ? Pus or a bad smell. Activity  For the first 2-3 days after your procedure, or as long as directed: ? Avoid climbing stairs as much as possible. ? Do not squat.  Do not lift anything that is heavier than 10 lb (4.5 kg), or the limit that you are told, until your health care provider says that it is safe.  Rest as  directed. ? Avoid sitting for a long time without moving. Get up to take short walks every 1-2 hours.  Do not drive for 24 hours if you were given a medicine to help you relax (sedative). General instructions  Take over-the-counter and prescription medicines only as told by your health care provider.  Keep all follow-up visits as told by your health care provider. This is important. Contact a health care provider if you have:  A fever or chills.  You have redness, swelling, or pain around your insertion site. Get help right away if:  The catheter insertion area swells very fast.  You pass out.  You suddenly start to sweat or your skin gets clammy.  The catheter insertion area is bleeding, and the bleeding does not stop when you hold steady pressure on the area.  The area near or just beyond the catheter insertion site becomes pale, cool, tingly, or numb. These symptoms may represent a serious problem that is an emergency. Do not wait to see if the symptoms will go away. Get medical help right away. Call your local emergency services (911 in the [place] Do not drive yourself to the hospital. Summary  After the procedure, it is common to have bruising that usually fades within 1-2 weeks.  Check your femoral site every day for signs of infection.  Do not lift anything that is heavier than 10 lb (4.5 kg), or the   limit that you are told, until your health care provider says that it is safe. This information is not intended to replace advice given to you by your health care provider. Make sure you discuss any questions you have with your health care provider. Document Revised: 01/19/2017 Document Reviewed: 01/19/2017 Elsevier Patient Education  2020 [employer] Inc[employer]  Cerebral Angiogram, Care After This sheet gives you information about how to care for yourself after your procedure. Your health care provider may also give you more specific instructions. If you have problems or questions,  contact your health care provider. What can I expect after the procedure? After the procedure, it is common to have:  Bruising and tenderness at the catheter insertion site.  A mild headache. Follow these instructions at home: Insertion site care  Follow instructions from your health care provider about how to take care of the insertion site. Make sure you: ? Wash your hands with soap and water before and after you change your bandage (dressing). If soap and water are not available, use hand sanitizer. ? Change your dressing as told by your health care provider.  Do not take baths, swim, or use a hot tub until your health care provider approves. You may shower 24-48 hours after the procedure, or as told by your health care provider.  To clean your insertion site: ? Gently wash the site with plain soap and water. ? Pat the area dry with a clean towel. ? Do not rub the site. This may cause bleeding.  Do not apply powder or lotion to the site. Keep the site clean and dry. Infection signs Check your incision area every day for signs of infection. Check for:  Redness, swelling, or pain.  Fluid or blood.  Warmth.  Pus or a bad smell.  Activity  Do not drive for 24 hours if you were given a sedative during your procedure.  Rest as told by your health care provider.  Do not lift anything that is heavier than 10 lb (4.5 kg), or the limit that you are told, until your health care provider says that it is safe.  Return to your normal activities as told by your health care provider, usually in about a week. Ask your health care provider what activities are safe for you. General instructions   If your insertion site starts to bleed, lie flat and put pressure on the site. If the bleeding does not stop, get help right away. This is a medical emergency.  Do not use any products that contain nicotine or tobacco, such as cigarettes, e-cigarettes, and chewing tobacco. If you need help  quitting, ask your health care provider.  Take over-the-counter and prescription medicines only as told by your health care provider.  Drink enough fluid to keep your urine pale yellow. This helps flush the contrast dye from your body.  Keep all follow-up visits as directed by your health care provider. This is important. Contact a health care provider if:  You have a fever or chills.  You have redness, swelling, or pain around your insertion site.  You have fluid or blood coming from your insertion site.  The insertion site feels warm to the touch.  You have pus or a bad smell coming from your insertion site.  You notice blood collecting in the tissue around the insertion site (hematoma). The hematoma may be painful to the touch. Get help right away if:  You have chest pain or trouble breathing.  You have severe pain  or swelling at the insertion site.  The insertion area bleeds, and bleeding continues after 30 minutes of holding steady pressure on the site.  The arm or leg where the catheter was inserted is pale, cold, numb, tingling, or weak.  You have a rash.  You have any symptoms of a stroke. "BE FAST" is an easy way to remember the main warning signs of a stroke: ? B - Balance. Signs are dizziness, sudden trouble walking, or loss of balance. ? E - Eyes. Signs are trouble seeing or a sudden change in vision. ? F - Face. Signs are sudden weakness or numbness of the face, or the face or eyelid drooping on one side. ? A - Arms. Signs are weakness or numbness in an arm. This happens suddenly and usually on one side of the body. ? S - Speech. Signs are sudden trouble speaking, slurred speech, or trouble understanding what people say. ? T - Time. Time to call emergency services. Write down what time symptoms started.  You have other signs of a stroke, such as: ? A sudden, severe headache with no known cause. ? Nausea or vomiting. ? Seizure. These symptoms may represent a  serious problem that is an emergency. Do not wait to see if the symptoms will go away. Get medical help right away. Call your local emergency services (911 in the [place] Do not drive yourself to the hospital. Summary  Bruising and tenderness at the insertion site are common.  Follow your health care provider's instructions about caring for your insertion site. Change dressing and clean the area as instructed.  If your insertion site bleeds, apply direct pressure until bleeding stops.  Return to your normal activities as told by your health care provider. Ask what activities are safe.  Rest and drink plenty of fluids. This information is not intended to replace advice given to you by your health care provider. Make sure you discuss any questions you have with your health care provider. Document Revised: 07/27/2018 Document Reviewed: 07/27/2018 Elsevier Patient Education  [place]. Moderate Conscious Sedation, Adult Sedation is the use of medicines to promote relaxation and relieve discomfort and anxiety. Moderate conscious sedation is a type of sedation. Under moderate conscious sedation, you are less alert than normal, but you are still able to respond to instructions, touch, or both. Moderate conscious sedation is used during short medical and dental procedures. It is milder than deep sedation, which is a type of sedation under which you cannot be easily woken up. It is also milder than general anesthesia, which is the use of medicines to make you unconscious. Moderate conscious sedation allows you to return to your regular activities sooner. Tell a health care provider about:  Any allergies you have.  All medicines you are taking, including vitamins, herbs, eye drops, creams, and over-the-counter medicines.  Use of steroids (by mouth or creams).  Any problems you or family members have had with sedatives and anesthetic medicines.  Any blood disorders you have.  Any surgeries  you have had.  Any medical conditions you have, such as sleep apnea.  Whether you are pregnant or may be pregnant.  Any use of cigarettes, alcohol, marijuana, or street drugs. What are the risks? Generally, this is a safe procedure. However, problems may occur, including:  Getting too much medicine (oversedation).  Nausea.  Allergic reaction to medicines.  Trouble breathing. If this happens, a breathing tube may be used to help with breathing. It will be removed  when you are awake and breathing on your own.  Heart trouble.  Lung trouble. What happens before the procedure? Staying hydrated Follow instructions from your health care provider about hydration, which may include:  Up to 2 hours before the procedure - you may continue to drink clear liquids, such as water, clear fruit juice, black coffee, and plain tea. Eating and drinking restrictions Follow instructions from your health care provider about eating and drinking, which may include:  8 hours before the procedure - stop eating heavy meals or foods such as meat, fried foods, or fatty foods.  6 hours before the procedure - stop eating light meals or foods, such as toast or cereal.  6 hours before the procedure - stop drinking milk or drinks that contain milk.  2 hours before the procedure - stop drinking clear liquids. Medicine Ask your health care provider about:  Changing or stopping your regular medicines. This is especially important if you are taking diabetes medicines or blood thinners.  Taking medicines such as aspirin and ibuprofen. These medicines can thin your blood. Do not take these medicines before your procedure if your health care provider instructs you not to.  Tests and exams  You will have a physical exam.  You may have blood tests done to show: ? How well your kidneys and liver are working. ? How well your blood can clot. General instructions  Plan to have someone take you home from the  hospital or clinic.  If you will be going home right after the procedure, plan to have someone with you for 24 hours. What happens during the procedure?  An IV tube will be inserted into one of your veins.  Medicine to help you relax (sedative) will be given through the IV tube.  The medical or dental procedure will be performed. What happens after the procedure?  Your blood pressure, heart rate, breathing rate, and blood oxygen level will be monitored often until the medicines you were given have worn off.  Do not drive for 24 hours. This information is not intended to replace advice given to you by your health care provider. Make sure you discuss any questions you have with your health care provider. Document Revised: 12/19/2016 Document Reviewed: 04/28/2015 Elsevier Patient Education  2020 [employer].

## 2019-05-20 NOTE — Brief Op Note (Signed)
  NEUROSURGERY BRIEF OPERATIVE  NOTE   PREOP DX: Dural AVF  POSTOP DX: Same  PROCEDURE: Diagnostic cerebral angiogram  SURGEON: Dr. Lisbeth Renshaw, MD  ANESTHESIA: IV Sedation with Local  EBL: Minimal  SPECIMENS: None  COMPLICATIONS: None  CONDITION: Stable to recovery  FINDINGS (Full report in [facility]): 1. Borden II, Cognard IIb right tentorial dural AVf

## 2019-05-20 NOTE — Progress Notes (Signed)
Ambulated in hallway tol well no bleeding noted before or after ambulation  

## 2019-05-20 NOTE — Progress Notes (Signed)
Discharge instructions reviewed with pt and his girl friend (via telephone) both voice understanding.  °

## 2019-05-20 NOTE — H&P (Signed)
Chief Complaint   AV dural fistula  HPI   HPI: Stephen Terry is a 54 y.o. 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  He presents today for diagnostic cerebral angiogram for further characterization.  He is without any concerns.  Patient Active Problem List   Diagnosis Date Noted  . AVM (arteriovenous malformation) brain 02/22/2019  . Hyperlipidemia 02/22/2019  . ETOH use 02/22/2019  . Overweight 02/22/2019  . 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 Hypertension     PSH: History reviewed. No pertinent surgical history.  (Not in a hospital admission)   SH: Social History   Tobacco Use  . Smoking status: Heavy Tobacco Smoker    Packs/day: 1.00    Types: Cigarettes  . Smokeless tobacco: Never Used  Substance Use Topics  . Alcohol use: Yes    Alcohol/week: 10.0 - 12.0 standard drinks    Types: 10 - 12 Cans of beer per week  . Drug use: Yes    Types: Marijuana    Comment: occassional    MEDS: Prior to Admission medications   Medication Sig Start Date End Date Taking? Authorizing Provider  aspirin EC 81 MG EC tablet Take 1 tablet (81 mg total) by mouth daily. 02/22/19   Donzetta Starch, NP  atorvastatin (LIPITOR) 80 MG tablet Take 1 tablet (80 mg total) by mouth daily at 6 PM. 02/22/19   Donzetta Starch, NP  lamoTRIgine (LAMICTAL) 200 MG tablet Take 200 mg by mouth daily.    [provider]  propranolol (INDERAL) 20 MG tablet Take 20-40 mg by mouth 3 (three) times daily as needed (For tremers).    [provider]  sertraline (ZOLOFT) 100 MG tablet Take 150 mg by mouth daily.    [provider]    ALLERGY: No Known Allergies  Social History   Tobacco Use  . Smoking status: Heavy Tobacco Smoker    Packs/day: 1.00    Types: Cigarettes  .  Smokeless tobacco: Never Used  Substance Use Topics  . Alcohol use: Yes    Alcohol/week: 10.0 - 12.0 standard drinks    Types: 10 - 12 Cans of beer per week     History reviewed. No pertinent family history.   ROS   ROS  Exam   Vitals:   05/20/19 0703  BP: (!) 150/91  Pulse: 74  Resp: 16  Temp: 98.1 F (36.7 C)  SpO2: 98%   General appearance: WDWN, NAD Eyes: No scleral injection Cardiovascular: Regular rate and rhythm without murmurs, rubs, gallops. No edema or variciosities. Distal pulses normal. Pulmonary: Effort normal, non-labored breathing Musculoskeletal:     Muscle tone upper extremities: Normal    Muscle tone lower extremities: Normal    Motor exam: Upper Extremities Deltoid Bicep Tricep Grip  Right 5/5 5/5 5/5 5/5  Left 5/5 5/5 5/5 5/5   Lower Extremity IP Quad PF DF EHL  Right 5/5 5/5 5/5 5/5 5/5  Left 5/5 5/5 5/5 5/5 5/5   Neurological Mental Status:    - Patient is awake, alert, oriented to person, place, month, year, and situation    - Patient is able to give a clear and coherent history.    - No signs of aphasia or neglect Cranial Nerves    - II: Visual Fields  are full. PERRL    - III/IV/VI: EOMI without ptosis or diploplia.     - V: Facial sensation is grossly normal    - VII: Facial movement is symmetric.     - VIII: hearing is intact to voice    - X: Uvula elevates symmetrically    - XI: Shoulder shrug is symmetric.    - XII: tongue is midline without atrophy or fasciculations.  Sensory: Sensation grossly intact to LT  Results - Imaging/Labs   Results for orders placed or performed during the hospital encounter of 05/20/19 (from the past 48 hour(s))  APTT     Status: None   Collection Time: 05/20/19  7:00 AM  Result Value Ref Range   aPTT 27 24 - 36 seconds    Comment: Performed at [facility], [address], [place], [place] [address]  Basic metabolic panel     Status: Abnormal   Collection Time: 05/20/19  7:00 AM  Result  Value Ref Range   Sodium 141 135 - 145 mmol/L   Potassium 3.8 3.5 - 5.1 mmol/L   Chloride 109 98 - 111 mmol/L   CO2 22 22 - 32 mmol/L   Glucose, Bld 104 (H) 70 - 99 mg/dL    Comment: Glucose reference range applies only to samples taken after fasting for at least 8 hours.   BUN 10 6 - 20 mg/dL   Creatinine, Ser 2.42 0.61 - 1.24 mg/dL   Calcium 8.3 (L) 8.9 - 10.3 mg/dL   GFR calc non Af Amer >60 >60 mL/min   GFR calc Af Amer >60 >60 mL/min   Anion gap 10 5 - 15    Comment: Performed at [facility], [address] [place], [place] [address]  CBC WITH DIFFERENTIAL     Status: Abnormal   Collection Time: 05/20/19  7:00 AM  Result Value Ref Range   WBC 10.5 4.0 - 10.5 K/uL   RBC 4.90 4.22 - 5.81 MIL/uL   Hemoglobin 15.5 13.0 - 17.0 g/dL   HCT 44.3 15.4 - 00.8 %   MCV 94.7 80.0 - 100.0 fL   MCH 31.6 26.0 - 34.0 pg   MCHC 33.4 30.0 - 36.0 g/dL   RDW 67.6 19.5 - 09.3 %   Platelets 241 150 - 400 K/uL   nRBC 0.0 0.0 - 0.2 %   Neutrophils Relative % 53 %   Neutro Abs 5.7 1.7 - 7.7 K/uL   Lymphocytes Relative 28 %   Lymphs Abs 2.9 0.7 - 4.0 K/uL   Monocytes Relative 8 %   Monocytes Absolute 0.8 0.1 - 1.0 K/uL   Eosinophils Relative 9 %   Eosinophils Absolute 0.9 (H) 0.0 - 0.5 K/uL   Basophils Relative 1 %   Basophils Absolute 0.1 0.0 - 0.1 K/uL   Immature Granulocytes 1 %   Abs Immature Granulocytes 0.06 0.00 - 0.07 K/uL    Comment: Performed at [facility], [address], [place], [place] [address]  Protime-INR     Status: None   Collection Time: 05/20/19  7:00 AM  Result Value Ref Range   Prothrombin Time 11.8 11.4 - 15.2 seconds   INR 0.9 0.8 - 1.2    Comment: (NOTE) INR goal varies based on device and disease states. Performed at [facility], [address], [place], [place] [address]     No results found.  IMAGING: MRI of the brain dated 02/21/2019 was personally reviewed.  In addition to acute stroke noted in the right precentral gyrus, there is  significantly dilated vein along the right tentorium highly suggestive of dural AV fistula.  CT angiogram dated 02/20/2019 was also personally reviewed.  This again does demonstrate significantly dilated vessels along the right tentorium.  There does appear to be a venous varix at the level of the right transverse sigmoid junction.  There is also significant contrast opacification of the transverse and sigmoid sinuses as well as the jugular veins bilaterally despite adequately timed contrast bolus for the CT angiogram suggesting venous shunting.  Impression/Plan   54 y.o. 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 we will proceed with diagnostic cerebral angiogram for further characterization of the dural AV fistula  While in the office risks, benefits and alternatives were discussed.  Patient stated understanding and wished to proceed.  Lisbeth Renshaw, MD [facility]

## 2019-05-24 ENCOUNTER — Other Ambulatory Visit: Payer: Self-pay | Admitting: Neurosurgery

## 2019-05-24 ENCOUNTER — Encounter (HOSPITAL_COMMUNITY): Payer: Self-pay

## 2019-05-24 DIAGNOSIS — I671 Cerebral aneurysm, nonruptured: Secondary | ICD-10-CM

## 2019-05-27 ENCOUNTER — Other Ambulatory Visit: Payer: Self-pay

## 2019-05-27 ENCOUNTER — Ambulatory Visit (INDEPENDENT_AMBULATORY_CARE_PROVIDER_SITE_OTHER): Payer: BC Managed Care – PPO | Admitting: Internal Medicine

## 2019-05-27 ENCOUNTER — Encounter: Payer: Self-pay | Admitting: Internal Medicine

## 2019-05-27 DIAGNOSIS — I639 Cerebral infarction, unspecified: Secondary | ICD-10-CM | POA: Insufficient documentation

## 2019-05-27 NOTE — Patient Instructions (Addendum)
Medication Instructions:  Your physician recommends that you continue on your current medications as directed. Please refer to the Current Medication list given to you today.  Labwork: None ordered.  Testing/Procedures: None ordered.  Follow-Up:  Your physician wants you to follow-up in: one year with Dr. Taylor.  You will receive a reminder letter in the mail two months in advance. If you don't receive a letter, please call our office to schedule the follow-up appointment.   Any Other Special Instructions Will Be Listed Below (If Applicable).  If you need a refill on your cardiac medications before your next appointment, please call your pharmacy.    Implantable Loop Recorder Placement, Care After This sheet gives you information about how to care for yourself after your procedure. Your health care provider may also give you more specific instructions. If you have problems or questions, contact your health care provider. What can I expect after the procedure? After the procedure, it is common to have:  Soreness or discomfort near the incision.  Some swelling or bruising near the incision. Follow these instructions at home: Incision care   Follow instructions from your health care provider about how to take care of your incision. Make sure you: ? Leave your outer dressing on for 72 hours.  After 72 hours you can remove that and shower. ? Leave adhesive strips in place. These skin closures may need to stay in place for 2 weeks or longer. If adhesive strip edges start to loosen and curl up, you may trim the loose edges. Do not remove adhesive strips completely unless your health care provider tells you to do that.  Check your incision area every day for signs of infection. Check for: ? Redness, swelling, or pain. ? Fluid or blood. ? Warmth. ? Pus or a bad smell. Do not take baths, swim, or use a hot tub until your incision is completely healed.  Activity  Return to your  normal activities.  General instructions  Follow instructions from your health care provider about how to manage your implantable loop recorder and transmit the information. Learn how to activate a recording if this is necessary for your type of device.  Do not go through a metal detection gate, and do not let someone hold a metal detector over your chest. Show your ID card.  Do not have an MRI unless you check with your health care provider first.  Take over-the-counter and prescription medicines only as told by your health care provider.  Keep all follow-up visits as told by your health care provider. This is important. Contact a health care provider if:  You have redness, swelling, or pain around your incision.  You have a fever.  You have pain that is not relieved by your pain medicine.  You have triggered your device because of fainting (syncope) or because of a heartbeat that feels like it is racing, slow, fluttering, or skipping (palpitations). Get help right away if you have:  Chest pain.  Difficulty breathing. Summary  After the procedure, it is common to have soreness or discomfort near the incision.  Change your dressing as told by your health care provider.  Follow instructions from your health care provider about how to manage your implantable loop recorder and transmit the information.  Keep all follow-up visits as told by your health care provider. This is important. This information is not intended to replace advice given to you by your health care provider. Make sure you discuss any questions you   have with your health care provider. Document Released: 12/18/2014 Document Revised: 02/21/2017 Document Reviewed: 02/21/2017 Elsevier Patient Education  2020 [employer]   

## 2019-05-27 NOTE — Progress Notes (Signed)
HPI Mr. Hartsell returns today for additional followup, s/p cryptogenic stroke. He has had a nice recovery and feels well. He denies chest pain or sob. No syncope and no palpitations. He wore a cardiac monitor which demonstrated no atrial fib.  No Known Allergies   Current Outpatient Medications  Medication Sig Dispense Refill  . aspirin EC 81 MG EC tablet Take 1 tablet (81 mg total) by mouth daily.    Marland Kitchen atorvastatin (LIPITOR) 80 MG tablet Take 1 tablet (80 mg total) by mouth daily at 6 PM. 30 tablet 2  . lamoTRIgine (LAMICTAL) 200 MG tablet Take 200 mg by mouth daily.    . propranolol (INDERAL) 20 MG tablet Take 20-40 mg by mouth 3 (three) times daily as needed (For tremers).    . sertraline (ZOLOFT) 100 MG tablet Take 150 mg by mouth daily.     No current facility-administered medications for this visit.     Past Medical History:  Diagnosis Date  . Hypertension     ROS:   All systems reviewed and negative except as noted in the HPI.   Past Surgical History:  Procedure Laterality Date  . IR ANGIO EXTERNAL CAROTID SEL EXT CAROTID UNI L MOD SED  05/20/2019  . IR ANGIO EXTERNAL CAROTID SEL EXT CAROTID UNI R MOD SED  05/20/2019  . IR ANGIO INTRA EXTRACRAN SEL INTERNAL CAROTID BILAT MOD SED  05/20/2019  . IR ANGIO VERTEBRAL SEL VERTEBRAL BILAT MOD SED  05/20/2019     History reviewed. No pertinent family history.   Social History   Socioeconomic History  . Marital status: Legally Separated    Spouse name: Not on file  . Number of children: Not on file  . Years of education: Not on file  . Highest education level: Not on file  Occupational History  . Not on file  Tobacco Use  . Smoking status: Heavy Tobacco Smoker    Packs/day: 1.00    Types: Cigarettes  . Smokeless tobacco: Never Used  Substance and Sexual Activity  . Alcohol use: Yes    Alcohol/week: 10.0 - 12.0 standard drinks    Types: 10 - 12 Cans of beer per week  . Drug use: Yes    Types: Marijuana   Comment: occassional  . Sexual activity: Not on file  Other Topics Concern  . Not on file  Social History Narrative  . Not on file   Social Determinants of Health   Financial Resource Strain:   . Difficulty of Paying Living Expenses:   Food Insecurity:   . Worried About Programme researcher, broadcasting/film/video in the Last Year:   . Barista in the Last Year:   Transportation Needs:   . Freight forwarder (Medical):   Marland Kitchen Lack of Transportation (Non-Medical):   Physical Activity:   . Days of Exercise per Week:   . Minutes of Exercise per Session:   Stress:   . Feeling of Stress :   Social Connections:   . Frequency of Communication with Friends and Family:   . Frequency of Social Gatherings with Friends and Family:   . Attends Religious Services:   . Active Member of Clubs or Organizations:   . Attends Banker Meetings:   Marland Kitchen Marital Status:   Intimate Partner Violence:   . Fear of Current or Ex-Partner:   . Emotionally Abused:   Marland Kitchen Physically Abused:   . Sexually Abused:      BP 140/74  Pulse 65   Ht 6\' 3"  (1.905 m)   Wt 218 lb (98.9 kg)   SpO2 95%   BMI 27.25 kg/m   Physical Exam:  Well appearing NAD HEENT: Unremarkable Neck:  No JVD, no thyromegally Lymphatics:  No adenopathy Back:  No CVA tenderness Lungs:  Clear HEART:  Regular rate rhythm, no murmurs, no rubs, no clicks Abd:  soft, positive bowel sounds, no organomegally, no rebound, no guarding Ext:  2 plus pulses, no edema, no cyanosis, no clubbing Skin:  No rashes no nodules Neuro:  CN II through XII intact, motor grossly intact  Assess/Plan: 1. Cryptogenic stroke - I have discussed the treatment options and the risks/benefits/goals/expectations of ILR insertion were reviewed and he wishes to proceed.  EP Procedure Note  After informed consent was obtained, the patient was prepped and draped in a sterile fashion. 10 cc of lidocaine was infiltred into the left pectoral region. A one cm stab  incision was carried out. The Medtronic ILR, [id number], was inserted. The R waves measured 0.4 MV. Hemostasis was assured with direct pressure. Benzoin and steristrips were painted on the skin. A bandage was placed. The patient was recovered in the usual manner.  Complications: none immediately  Conclusion: successful ILR insertion   Gracie Gupta,M.D.

## 2019-05-30 ENCOUNTER — Telehealth: Payer: Self-pay | Admitting: Emergency Medicine

## 2019-05-30 ENCOUNTER — Other Ambulatory Visit: Payer: Self-pay

## 2019-05-30 NOTE — Telephone Encounter (Signed)
No transmission received from [facility]. Patient does not have Medtronic APP open and running in the background on his phone/ Education done on having app open to allow automatic transmissions.

## 2019-05-30 NOTE — Patient Outreach (Signed)
First telephone outreach attempt to obtain mRS. No answer. Left message for returned call.  Edyn Qazi Mikle [facility]-Care Management Assistant [phone] 

## 2019-05-31 NOTE — Telephone Encounter (Signed)
Data transmission received last night.  Left message for pt advising.

## 2019-06-02 ENCOUNTER — Other Ambulatory Visit: Payer: Self-pay

## 2019-06-02 NOTE — Patient Outreach (Signed)
Second telephone outreach attempt to obtain mRS. No answer. Left message for returned call.  Aarti Mankowski Cadin [facility]-Care Management Assistant [phone] 

## 2019-06-03 ENCOUNTER — Other Ambulatory Visit: Payer: Self-pay

## 2019-06-03 NOTE — Patient Outreach (Signed)
3 outreach attempts were completed to obtain mRs. mRs could not be obtained because patient never returned my calls. mRs=7    Stephen Terry Care Management Assistant [phone] 

## 2019-06-09 ENCOUNTER — Other Ambulatory Visit (HOSPITAL_COMMUNITY): Payer: Self-pay | Admitting: Neurosurgery

## 2019-06-09 DIAGNOSIS — I671 Cerebral aneurysm, nonruptured: Secondary | ICD-10-CM

## 2019-06-15 ENCOUNTER — Other Ambulatory Visit: Payer: Self-pay | Admitting: Neurosurgery

## 2019-06-15 NOTE — Progress Notes (Signed)
[id number] [place], [place] - [address] [place] [address] Phone: [phone] Fax: [phone]  [id number] [place], [place] - [address] AT [facility] [address] [place] [address] Phone: [phone] Fax: [phone]      Your procedure is scheduled on Tuesday June 1  Report to [facility] Main Entrance "A" at 0900 A.M., and check in at the Admitting office.  Call this number if you have problems the morning of surgery:  [phone]  Call [phone] if you have any questions prior to your surgery date Monday-Friday 8am-4pm    Remember:  Do not eat or drink after midnight the night before your surgery     Take these medicines the morning of surgery with A SIP OF WATER  lamoTRIgine (LAMICTAL propranolol (INDERAL sertraline (ZOLOFT)  Follow your surgeon's instructions on when to stop Aspirin.  If no instructions were given by your surgeon then you will need to call the office to get those instructions.    As of today, STOP taking any Aleve, Naproxen, Ibuprofen, Motrin, Advil, Goody's, BC's, all herbal medications, fish oil, and all vitamins.                      Do not wear jewelry            Do not wear lotions, powders, colognes, or deodorant.            Men may shave face and neck.            Do not bring valuables to the hospital.            [facility] is not responsible for any belongings or valuables.  Do NOT Smoke (Tobacco/Vapping) or drink Alcohol 24 hours prior to your procedure If you use a CPAP at night, you may bring all equipment for your overnight stay.   Contacts, glasses, dentures or bridgework may not be worn into surgery.      For patients admitted to the hospital, discharge time will be determined by your treatment team.   Patients discharged the day of surgery will not be allowed to drive home, and someone needs to stay with them for 24  hours.    Special instructions:   [facility]- Preparing For Surgery  Before surgery, you can play an important role. Because skin is not sterile, your skin needs to be as free of germs as possible. You can reduce the number of germs on your skin by washing with CHG (chlorahexidine gluconate) Soap before surgery.  CHG is an antiseptic cleaner which kills germs and bonds with the skin to continue killing germs even after washing.    Oral Hygiene is also important to reduce your risk of infection.  Remember - BRUSH YOUR TEETH THE MORNING OF SURGERY WITH YOUR REGULAR TOOTHPASTE  Please do not use if you have an allergy to CHG or antibacterial soaps. If your skin becomes reddened/irritated stop using the CHG.  Do not shave (including legs and underarms) for at least 48 hours prior to first CHG shower. It is OK to shave your face.  Please follow these instructions carefully.   1. Shower the NIGHT BEFORE SURGERY and the MORNING OF SURGERY with CHG Soap.   2. If you chose to wash your hair, wash your hair first as usual with your normal shampoo.  3. After you shampoo, rinse your hair and body thoroughly to remove  the shampoo.  4. Use CHG as you would any other liquid soap. You can apply CHG directly to the skin and wash gently with a scrungie or a clean washcloth.   5. Apply the CHG Soap to your body ONLY FROM THE NECK DOWN.  Do not use on open wounds or open sores. Avoid contact with your eyes, ears, mouth and genitals (private parts). Wash Face and genitals (private parts)  with your normal soap.   6. Wash thoroughly, paying special attention to the area where your surgery will be performed.  7. Thoroughly rinse your body with warm water from the neck down.  8. DO NOT shower/wash with your normal soap after using and rinsing off the CHG Soap.  9. Pat yourself dry with a CLEAN TOWEL.  10. Wear CLEAN PAJAMAS to bed the night before surgery, wear comfortable clothes the morning of  surgery  11. Place CLEAN SHEETS on your bed the night of your first shower and DO NOT SLEEP WITH PETS.   Day of Surgery:   Do not apply any deodorants/lotions.  Please wear clean clothes to the hospital/surgery center.   Remember to brush your teeth WITH YOUR REGULAR TOOTHPASTE.   Please read over the following fact sheets that you were given.

## 2019-06-16 ENCOUNTER — Other Ambulatory Visit: Payer: Self-pay | Admitting: Neurosurgery

## 2019-06-16 ENCOUNTER — Encounter (HOSPITAL_COMMUNITY)
Admission: RE | Admit: 2019-06-16 | Discharge: 2019-06-16 | Disposition: A | Discharge status: Home / Self Care | Payer: BC Managed Care – PPO | Source: Ambulatory Visit | Attending: Neurosurgery | Admitting: Neurosurgery

## 2019-06-16 ENCOUNTER — Encounter (HOSPITAL_COMMUNITY): Payer: Self-pay

## 2019-06-16 ENCOUNTER — Other Ambulatory Visit: Payer: Self-pay

## 2019-06-16 DIAGNOSIS — Z01812 Encounter for preprocedural laboratory examination: Secondary | ICD-10-CM | POA: Insufficient documentation

## 2019-06-16 HISTORY — DX: Cerebral infarction, unspecified: I63.9

## 2019-06-16 HISTORY — DX: Anxiety disorder, unspecified: F41.9

## 2019-06-16 HISTORY — DX: Depression, unspecified: F32.A

## 2019-06-16 HISTORY — DX: Bipolar disorder, unspecified: F31.9

## 2019-06-16 LAB — COMPREHENSIVE METABOLIC PANEL
ALT: 37 U/L (ref 0–44)
AST: 41 U/L (ref 15–41)
Albumin: 3.6 g/dL (ref 3.5–5.0)
Alkaline Phosphatase: 121 U/L (ref 38–126)
Anion gap: 9 (ref 5–15)
BUN: 10 mg/dL (ref 6–20)
CO2: 25 mmol/L (ref 22–32)
Calcium: 8.6 mg/dL — ABNORMAL LOW (ref 8.9–10.3)
Chloride: 103 mmol/L (ref 98–111)
Creatinine, Ser: 0.95 mg/dL (ref 0.61–1.24)
GFR calc Af Amer: >60 mL/min (ref >60)
GFR calc non Af Amer: >60 mL/min (ref >60)
Glucose, Bld: 102 mg/dL — ABNORMAL HIGH (ref 70–99)
Potassium: 4.7 mmol/L (ref 3.5–5.1)
Sodium: 137 mmol/L (ref 135–145)
Total Bilirubin: 0.4 mg/dL (ref 0.3–1.2)
Total Protein: 7 g/dL (ref 6.5–8.1)

## 2019-06-16 LAB — SURGICAL PCR SCREEN
MRSA, PCR: NEGATIVE
Staphylococcus aureus: NEGATIVE

## 2019-06-16 LAB — CBC
HCT: 48.8 % (ref 39.0–52.0)
Hemoglobin: 16.1 g/dL (ref 13.0–17.0)
MCH: 31.8 pg (ref 26.0–34.0)
MCHC: 33 g/dL (ref 30.0–36.0)
MCV: 96.4 fL (ref 80.0–100.0)
Platelets: 270 10*3/uL (ref 150–400)
RBC: 5.06 MIL/uL (ref 4.22–5.81)
RDW: 13.7 % (ref 11.5–15.5)
WBC: 13.7 10*3/uL — ABNORMAL HIGH (ref 4.0–10.5)
nRBC: 0 % (ref 0.0–0.2)

## 2019-06-16 NOTE — Progress Notes (Signed)
PCP: Jacolyn Reedy, PA-C Cardiologist: Denies regular cardiologist. Loop recorder inserted by Dr. Ladona Ridgel post stroke  EKG: 02-20-19 CXR: n/a ECHO: 02-21-19 Stress Test: denies Cardiac Cath: denies  Instructed to call surgeon regarding continuing/holding ASA  Instructed no smoking (cigarettes or mariajuana) prior to surgery. Pt reports drinking 8-10 beers a day with additional bourbon on weekends.  Going for Covid testing 06/17/19 at [facility]  Patient denies shortness of breath, fever, cough, and chest pain at PAT appointment.  Patient verbalized understanding of instructions provided today at the PAT appointment.  Patient asked to review instructions at home and day of surgery.

## 2019-06-17 ENCOUNTER — Other Ambulatory Visit (HOSPITAL_COMMUNITY)
Admission: RE | Admit: 2019-06-17 | Discharge: 2019-06-17 | Disposition: A | Discharge status: Home / Self Care | Payer: BC Managed Care – PPO | Source: Ambulatory Visit | Attending: Neurosurgery | Admitting: Neurosurgery

## 2019-06-17 ENCOUNTER — Other Ambulatory Visit: Payer: Self-pay | Admitting: Student

## 2019-06-17 DIAGNOSIS — Z20822 Contact with and (suspected) exposure to covid-19: Secondary | ICD-10-CM | POA: Insufficient documentation

## 2019-06-17 DIAGNOSIS — Z01812 Encounter for preprocedural laboratory examination: Secondary | ICD-10-CM | POA: Insufficient documentation

## 2019-06-17 LAB — SARS CORONAVIRUS 2 (TAT 6-24 HRS): SARS Coronavirus 2: NEGATIVE

## 2019-06-17 NOTE — Anesthesia Preprocedure Evaluation (Addendum)
Anesthesia Evaluation  Patient identified by MRN, date of birth, ID band Patient awake    Reviewed: Allergy & Precautions, H&P , NPO status , Patient's Chart, lab work & pertinent test results  Airway Mallampati: II   Neck ROM: full    Dental   Pulmonary Current Smoker and Patient abstained from smoking.,    breath sounds clear to auscultation       Cardiovascular hypertension,  Rhythm:regular Rate:Normal     Neuro/Psych PSYCHIATRIC DISORDERS Anxiety Depression Bipolar Disorder Intracranial AVM CVA    GI/Hepatic (+)     substance abuse  alcohol use,   Endo/Other    Renal/GU      Musculoskeletal   Abdominal   Peds  Hematology   Anesthesia Other Findings   Reproductive/Obstetrics                           Anesthesia Physical Anesthesia Plan  ASA: III  Anesthesia Plan: General   Post-op Pain Management:    Induction: Intravenous  PONV Risk Score and Plan: 1 and Ondansetron, Dexamethasone, Midazolam and Treatment may vary due to age or medical condition  Airway Management Planned: Oral ETT  Additional Equipment: Arterial line  Intra-op Plan:   Post-operative Plan: Extubation in OR  Informed Consent: I have reviewed the patients History and Physical, chart, labs and discussed the procedure including the risks, benefits and alternatives for the proposed anesthesia with the patient or authorized representative who has indicated his/her understanding and acceptance.       Plan Discussed with: CRNA, Anesthesiologist and Surgeon  Anesthesia Plan Comments: (PAT note by Karoline Caldwell, PA-C: Recent admission 1/31-02/22/19 for CVA. Per discharge summary, "Mr.Stephen Smithis a 54 y.o.malewith no significant past medicalhistory presenting withL facial droop and slurred speech.Received tPA 02/20/19 at 1613. Tolerated tPA without difficulties. No residual deficits. For d/c home on DAPT x 3  weeks then aspirin alone. Follow up with neurosurgeon for incidental AVM finding."  Hx of etoh abuse, at time of admission for CVA he reported 12 beers/day.  Post discharge pt has followed up with cardiology for cryptogenic stroke. He wore an event monitor that was benign. He underwent placement of ILR on 05/27/19.   Preop labs reviewed, unremarkable.   EKG 02/20/19: Sinus rhythm. Rate 71.  Event monitor 04/21/19: Sinus rhythm No atrial fibrillation Baseline artifact limits interpretation at times Consider EP referral for long term monitoring if additional monitoring is desired  TTE 02/21/19: 1. Left ventricular ejection fraction, by visual estimation, is 60 to  65%. The left ventricle has normal function. There is no left ventricular  hypertrophy.  2. Left ventricular diastolic parameters are consistent with Grade I  diastolic dysfunction (impaired relaxation).  3. The left ventricle has no regional wall motion abnormalities.  4. Global right ventricle has normal systolic function.The right  ventricular size is normal.  5. Left atrial size was normal.  6. Right atrial size was normal.  7. The mitral valve is normal in structure. No evidence of mitral valve  regurgitation. No evidence of mitral stenosis.  8. The tricuspid valve is normal in structure. Tricuspid valve  regurgitation is trivial.  9. The aortic valve is tricuspid. Aortic valve regurgitation is not  visualized. No evidence of aortic valve sclerosis or stenosis.  10. The pulmonic valve was normal in structure. Pulmonic valve  regurgitation is not visualized.  11. The inferior vena cava is normal in size with greater than 50%  respiratory variability, suggesting  right atrial pressure of 3 mmHg.  12. Normal LV systolic function; grade 1 diastolic dysfunction.    )       Anesthesia Quick Evaluation

## 2019-06-17 NOTE — Progress Notes (Signed)
Anesthesia Chart Review:  Recent admission 1/31-02/22/19 for CVA. Per discharge summary, "Mr.Stephen Smithis a 53 y.o.malewith no significant past medicalhistory presenting withL facial droop and slurred speech.Received tPA 02/20/19 at 1613. Tolerated tPA without difficulties. No residual deficits. For d/c home on DAPT x 3 weeks then aspirin alone. Follow up with neurosurgeon for incidental AVM finding."  Hx of etoh abuse, at time of admission for CVA he reported 12 beers/day.  Post discharge pt has followed up with cardiology for cryptogenic stroke. He wore an event monitor that was benign. He underwent placement of ILR on 05/27/19.   Preop labs reviewed, unremarkable.   EKG 02/20/19: Sinus rhythm. Rate 71.  Event monitor 04/21/19: Sinus rhythm No atrial fibrillation Baseline artifact limits interpretation at times Consider EP referral for long term monitoring if additional monitoring is desired  TTE 02/21/19: 1. Left ventricular ejection fraction, by visual estimation, is 60 to  65%. The left ventricle has normal function. There is no left ventricular  hypertrophy.  2. Left ventricular diastolic parameters are consistent with Grade I  diastolic dysfunction (impaired relaxation).  3. The left ventricle has no regional wall motion abnormalities.  4. Global right ventricle has normal systolic function.The right  ventricular size is normal.  5. Left atrial size was normal.  6. Right atrial size was normal.  7. The mitral valve is normal in structure. No evidence of mitral valve  regurgitation. No evidence of mitral stenosis.  8. The tricuspid valve is normal in structure. Tricuspid valve  regurgitation is trivial.  9. The aortic valve is tricuspid. Aortic valve regurgitation is not  visualized. No evidence of aortic valve sclerosis or stenosis.  10. The pulmonic valve was normal in structure. Pulmonic valve  regurgitation is not visualized.  11. The inferior vena cava is normal  in size with greater than 50%  respiratory variability, suggesting right atrial pressure of 3 mmHg.  12. Normal LV systolic function; grade 1 diastolic dysfunction.    Stephen Terry, Stephen Terry [facility] Short Stay Center/Anesthesiology Phone [phone] 06/17/2019 9:01 AM

## 2019-06-17 NOTE — H&P (Signed)
Chief Complaint   AVM  HPI   HPI: Stephen Terry is a 54 y.o. 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   Given patient's young age and presence of cortical venous reflux, it was recommended he undergo treatment due to the risk of intracranial hemorrhage.  He presents today for onyx embolization of the right dural AV fistula.  He is without any concerns.  Patient Active Problem List   Diagnosis Date Noted  . Cryptogenic stroke (HCC) 05/27/2019  . AVM (arteriovenous malformation) brain 02/22/2019  . Hyperlipidemia 02/22/2019  . ETOH use 02/22/2019  . Overweight 02/22/2019  . 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 Anxiety   . Bipolar disorder (HCC)   . Depression   . Hypertension   . Stroke [facility])     PSH: Past Surgical History:  Procedure Laterality Date  . IR ANGIO EXTERNAL CAROTID SEL EXT CAROTID UNI L MOD SED  05/20/2019  . IR ANGIO EXTERNAL CAROTID SEL EXT CAROTID UNI R MOD SED  05/20/2019  . IR ANGIO INTRA EXTRACRAN SEL INTERNAL CAROTID BILAT MOD SED  05/20/2019  . IR ANGIO VERTEBRAL SEL VERTEBRAL BILAT MOD SED  05/20/2019  . LOOP RECORDER INSERTION    . WISDOM TOOTH EXTRACTION      No medications prior to admission.    SH: Social History   Tobacco Use  . Smoking status: Heavy Tobacco Smoker    Packs/day: 2.00    Types: Cigarettes  . Smokeless tobacco: Never Used  . Tobacco comment: 1.5-2 packs per day  Substance Use Topics  . Alcohol use: Yes    Alcohol/week: 54.0 standard drinks    Types: 54 Cans of beer per week    Comment: 6-8 per day, bourbon on weekends  . Drug use: Yes    Types: Marijuana    Comment: occassional    MEDS: Prior to Admission medications   Medication Sig Start Date End Date Taking? Authorizing Provider  aspirin EC 81 MG EC tablet Take 1 tablet (81 mg total) by  mouth daily. 02/22/19  Yes Layne Benton, NP  atorvastatin (LIPITOR) 80 MG tablet Take 1 tablet (80 mg total) by mouth daily at 6 PM. 02/22/19  Yes Layne Benton, NP  lamoTRIgine (LAMICTAL) 200 MG tablet Take 200 mg by mouth daily.   Yes [provider]  propranolol (INDERAL) 20 MG tablet Take 20-40 mg by mouth 3 (three) times daily as needed (For tremers).   Yes [provider]  sertraline (ZOLOFT) 100 MG tablet Take 100 mg by mouth daily.    Yes [provider]    ALLERGY: No Known Allergies  Social History   Tobacco Use  . Smoking status: Heavy Tobacco Smoker    Packs/day: 2.00    Types: Cigarettes  . Smokeless tobacco: Never Used  . Tobacco comment: 1.5-2 packs per day  Substance Use Topics  . Alcohol use: Yes    Alcohol/week: 54.0 standard drinks    Types: 54 Cans of beer per week    Comment: 6-8 per day, bourbon on weekends     No family history on file.   ROS   ROS  Exam   There were no vitals filed for this visit. General appearance: WDWN, NAD Eyes: No scleral injection Cardiovascular: Regular rate and rhythm without murmurs, rubs, gallops.  No edema or variciosities. Distal pulses normal. Pulmonary: Effort normal, non-labored breathing Musculoskeletal:     Muscle tone upper extremities: Normal    Muscle tone lower extremities: Normal    Motor exam: Upper Extremities Deltoid Bicep Tricep Grip  Right 5/5 5/5 5/5 5/5  Left 5/5 5/5 5/5 5/5   Lower Extremity IP Quad PF DF EHL  Right 5/5 5/5 5/5 5/5 5/5  Left 5/5 5/5 5/5 5/5 5/5   Neurological Mental Status:    - Patient is awake, alert, oriented to person, place, month, year, and situation    - Patient is able to give a clear and coherent history.    - No signs of aphasia or neglect Cranial Nerves    - II: Visual Fields are full. PERRL    - III/IV/VI: EOMI without ptosis or diploplia.     - V: Facial sensation is grossly normal    - VII: Facial movement is symmetric.     - VIII:  hearing is intact to voice    - X: Uvula elevates symmetrically    - XI: Shoulder shrug is symmetric.    - XII: tongue is midline without atrophy or fasciculations.  Sensory: Sensation grossly intact to LT  Results - Imaging/Labs   Results for orders placed or performed during the hospital encounter of 06/16/19 (from the past 48 hour(s))  Surgical pcr screen     Status: None   Collection Time: 06/16/19 11:31 AM   Specimen: Nasal Mucosa; Nasal Swab  Result Value Ref Range   MRSA, PCR NEGATIVE NEGATIVE   Staphylococcus aureus NEGATIVE NEGATIVE    Comment: (NOTE) The Xpert SA Assay (FDA approved for NASAL specimens in patients 32 years of age and older), is one component of a comprehensive surveillance program. It is not intended to diagnose infection nor to guide or monitor treatment. Performed at [facility], [address], [place], [place] [address]   Comprehensive metabolic panel     Status: Abnormal   Collection Time: 06/16/19 11:32 AM  Result Value Ref Range   Sodium 137 135 - 145 mmol/L   Potassium 4.7 3.5 - 5.1 mmol/L   Chloride 103 98 - 111 mmol/L   CO2 25 22 - 32 mmol/L   Glucose, Bld 102 (H) 70 - 99 mg/dL    Comment: Glucose reference range applies only to samples taken after fasting for at least 8 hours.   BUN 10 6 - 20 mg/dL   Creatinine, Ser 9.93 0.61 - 1.24 mg/dL   Calcium 8.6 (L) 8.9 - 10.3 mg/dL   Total Protein 7.0 6.5 - 8.1 g/dL   Albumin 3.6 3.5 - 5.0 g/dL   AST 41 15 - 41 U/L   ALT 37 0 - 44 U/L   Alkaline Phosphatase 121 38 - 126 U/L   Total Bilirubin 0.4 0.3 - 1.2 mg/dL   GFR calc non Af Amer >60 >60 mL/min   GFR calc Af Amer >60 >60 mL/min   Anion gap 9 5 - 15    Comment: Performed at [facility], [address], [place], [place] [address]  CBC     Status: Abnormal   Collection Time: 06/16/19 11:32 AM  Result Value Ref Range   WBC 13.7 (H) 4.0 - 10.5 K/uL   RBC 5.06 4.22 - 5.81 MIL/uL   Hemoglobin 16.1 13.0 - 17.0 g/dL   HCT 78.9  38.1 - 01.7 %   MCV 96.4 80.0 - 100.0 fL  MCH 31.8 26.0 - 34.0 pg   MCHC 33.0 30.0 - 36.0 g/dL   RDW 13.7 11.5 - 15.5 %   Platelets 270 150 - 400 K/uL   nRBC 0.0 0.0 - 0.2 %    Comment: Performed at [facility], [place] [address] [place], [place] [address]    No results found.  IMAGING: Diagnostic cerebral angiogram was again reviewed.  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  With his age and the presence of cortical venous reflux I do think that there is a real risk of intracranial hemorrhage.   We will proceed with onyx embolization of the right dural AV fistula.    While the office risks, benefits and alternatives were discussed.  Patient stated understanding and wished to proceed.

## 2019-06-21 ENCOUNTER — Inpatient Hospital Stay (HOSPITAL_COMMUNITY): Payer: BC Managed Care – PPO | Admitting: Physician Assistant

## 2019-06-21 ENCOUNTER — Inpatient Hospital Stay (HOSPITAL_COMMUNITY)
Admission: RE | Admit: 2019-06-21 | Discharge: 2019-06-22 | DRG: 027 | Disposition: A | Discharge status: Home / Self Care | Payer: BC Managed Care – PPO | Attending: Neurosurgery | Admitting: Neurosurgery

## 2019-06-21 ENCOUNTER — Encounter (HOSPITAL_COMMUNITY)
Admission: RE | Disposition: A | Discharge status: Home / Self Care | Payer: Self-pay | Source: Home / Self Care | Attending: Neurosurgery

## 2019-06-21 ENCOUNTER — Inpatient Hospital Stay (HOSPITAL_COMMUNITY)
Admission: RE | Admit: 2019-06-21 | Discharge: 2019-06-21 | Disposition: A | Discharge status: Home / Self Care | Payer: BC Managed Care – PPO | Source: Ambulatory Visit | Attending: Neurosurgery | Admitting: Neurosurgery

## 2019-06-21 ENCOUNTER — Other Ambulatory Visit: Payer: Self-pay | Admitting: Neurosurgery

## 2019-06-21 ENCOUNTER — Inpatient Hospital Stay (HOSPITAL_COMMUNITY): Payer: BC Managed Care – PPO | Admitting: Certified Registered Nurse Anesthetist

## 2019-06-21 ENCOUNTER — Encounter (HOSPITAL_COMMUNITY): Payer: Self-pay | Admitting: Neurosurgery

## 2019-06-21 DIAGNOSIS — I10 Essential (primary) hypertension: Secondary | ICD-10-CM | POA: Diagnosis present

## 2019-06-21 DIAGNOSIS — Z8673 Personal history of transient ischemic attack (TIA), and cerebral infarction without residual deficits: Secondary | ICD-10-CM | POA: Diagnosis not present

## 2019-06-21 DIAGNOSIS — F17210 Nicotine dependence, cigarettes, uncomplicated: Secondary | ICD-10-CM | POA: Diagnosis present

## 2019-06-21 DIAGNOSIS — Q282 Arteriovenous malformation of cerebral vessels: Principal | ICD-10-CM

## 2019-06-21 DIAGNOSIS — I671 Cerebral aneurysm, nonruptured: Secondary | ICD-10-CM | POA: Diagnosis present

## 2019-06-21 DIAGNOSIS — F319 Bipolar disorder, unspecified: Secondary | ICD-10-CM | POA: Diagnosis present

## 2019-06-21 DIAGNOSIS — Z7982 Long term (current) use of aspirin: Secondary | ICD-10-CM

## 2019-06-21 DIAGNOSIS — Z79899 Other long term (current) drug therapy: Secondary | ICD-10-CM | POA: Diagnosis not present

## 2019-06-21 DIAGNOSIS — E785 Hyperlipidemia, unspecified: Secondary | ICD-10-CM | POA: Diagnosis present

## 2019-06-21 HISTORY — PX: RADIOLOGY WITH ANESTHESIA: SHX6223

## 2019-06-21 HISTORY — PX: IR ANGIO EXTERNAL CAROTID SEL EXT CAROTID UNI R MOD SED: IMG5371

## 2019-06-21 HISTORY — PX: IR ANGIOGRAM FOLLOW UP STUDY: IMG697

## 2019-06-21 HISTORY — PX: IR ANGIO INTRA EXTRACRAN SEL INTERNAL CAROTID UNI R MOD SED: IMG5362

## 2019-06-21 HISTORY — PX: IR NEURO EACH ADD'L AFTER BASIC UNI RIGHT (MS): IMG5374

## 2019-06-21 HISTORY — PX: IR TRANSCATH/EMBOLIZ: IMG695

## 2019-06-21 LAB — URINALYSIS, ROUTINE W REFLEX MICROSCOPIC
Bilirubin Urine: NEGATIVE
Glucose, UA: NEGATIVE mg/dL
Hgb urine dipstick: NEGATIVE
Ketones, ur: NEGATIVE mg/dL
Leukocytes,Ua: NEGATIVE
Nitrite: NEGATIVE
Protein, ur: 30 mg/dL — AB
Specific Gravity, Urine: 1.021 (ref 1.005–1.030)
pH: 6 (ref 5.0–8.0)

## 2019-06-21 SURGERY — IR WITH ANESTHESIA
Anesthesia: General | Laterality: Right

## 2019-06-21 MED ORDER — CHLORHEXIDINE GLUCONATE CLOTH 2 % EX PADS: 6.0000 | MEDICATED_PAD | Freq: Every day | CUTANEOUS | Status: DC

## 2019-06-21 MED ORDER — ROCURONIUM BROMIDE 10 MG/ML (PF) SYRINGE
PREFILLED_SYRINGE | INTRAVENOUS | Status: DC | PRN
  Administered 2019-06-21: 40 mg via INTRAVENOUS
  Administered 2019-06-21 (×2): 30 mg via INTRAVENOUS
  Administered 2019-06-21: 70 mg via INTRAVENOUS
  Administered 2019-06-21: 20 mg via INTRAVENOUS
  Administered 2019-06-21: 30 mg via INTRAVENOUS

## 2019-06-21 MED ORDER — FENTANYL CITRATE (PF) 100 MCG/2ML IJ SOLN: 25.0000 ug | INTRAMUSCULAR | Status: DC | PRN

## 2019-06-21 MED ORDER — OXYCODONE HCL 5 MG/5ML PO SOLN: 5.0000 mg | Freq: Once | ORAL | Status: DC | PRN

## 2019-06-21 MED ORDER — ONDANSETRON HCL 4 MG/2ML IJ SOLN: 4.0000 mg | Freq: Four times a day (QID) | INTRAMUSCULAR | Status: DC | PRN

## 2019-06-21 MED ORDER — SUGAMMADEX SODIUM 200 MG/2ML IV SOLN
INTRAVENOUS | Status: DC | PRN
  Administered 2019-06-21: 200 mg via INTRAVENOUS

## 2019-06-21 MED ORDER — ONDANSETRON HCL 4 MG/2ML IJ SOLN: 4.0000 mg | INTRAMUSCULAR | Status: DC | PRN

## 2019-06-21 MED ORDER — ATORVASTATIN CALCIUM 80 MG PO TABS
80.0000 mg | ORAL_TABLET | Freq: Every day | ORAL | Status: DC
  Administered 2019-06-21: 80 mg via ORAL
  Filled 2019-06-21: qty 1

## 2019-06-21 MED ORDER — ONDANSETRON HCL 4 MG/2ML IJ SOLN
INTRAMUSCULAR | Status: DC | PRN
  Administered 2019-06-21: 4 mg via INTRAVENOUS

## 2019-06-21 MED ORDER — HEPARIN SODIUM (PORCINE) 1000 UNIT/ML IJ SOLN
INTRAMUSCULAR | Status: DC | PRN
Start: 2019-06-21 — End: 2019-06-21
  Administered 2019-06-21 (×2): 1000 [IU] via INTRAVENOUS
  Administered 2019-06-21: 3000 [IU] via INTRAVENOUS

## 2019-06-21 MED ORDER — LIDOCAINE 2% (20 MG/ML) 5 ML SYRINGE
INTRAMUSCULAR | Status: DC | PRN
  Administered 2019-06-21: 40 mg via INTRAVENOUS
  Administered 2019-06-21: 60 mg via INTRAVENOUS

## 2019-06-21 MED ORDER — PHENYLEPHRINE HCL-NACL 10-0.9 MG/250ML-% IV SOLN
INTRAVENOUS | Status: DC | PRN
  Administered 2019-06-21: 20 ug/min via INTRAVENOUS

## 2019-06-21 MED ORDER — PROPRANOLOL HCL 20 MG PO TABS: 20.0000 mg | ORAL_TABLET | Freq: Three times a day (TID) | ORAL | Status: DC | PRN

## 2019-06-21 MED ORDER — LACTATED RINGERS IV SOLN: INTRAVENOUS | Status: DC | PRN

## 2019-06-21 MED ORDER — CHLORHEXIDINE GLUCONATE CLOTH 2 % EX PADS: 6.0000 | MEDICATED_PAD | Freq: Once | CUTANEOUS | Status: DC

## 2019-06-21 MED ORDER — PROPOFOL 10 MG/ML IV BOLUS
INTRAVENOUS | Status: DC | PRN
  Administered 2019-06-21: 20 mg via INTRAVENOUS
  Administered 2019-06-21: 50 mg via INTRAVENOUS
  Administered 2019-06-21: 200 mg via INTRAVENOUS
  Administered 2019-06-21: 50 mg via INTRAVENOUS
  Administered 2019-06-21: 30 mg via INTRAVENOUS
  Administered 2019-06-21: 50 mg via INTRAVENOUS

## 2019-06-21 MED ORDER — SODIUM CHLORIDE 0.9 % IV SOLN: INTRAVENOUS | Status: DC

## 2019-06-21 MED ORDER — ASPIRIN EC 81 MG PO TBEC
81.0000 mg | DELAYED_RELEASE_TABLET | Freq: Every day | ORAL | Status: DC
  Administered 2019-06-21: 81 mg via ORAL
  Filled 2019-06-21 (×3): qty 1

## 2019-06-21 MED ORDER — PANTOPRAZOLE SODIUM 40 MG IV SOLR
40.0000 mg | Freq: Every day | INTRAVENOUS | Status: DC
  Administered 2019-06-21: 40 mg via INTRAVENOUS
  Filled 2019-06-21: qty 40

## 2019-06-21 MED ORDER — CEFAZOLIN SODIUM-DEXTROSE 2-4 GM/100ML-% IV SOLN
INTRAVENOUS | Status: AC
  Filled 2019-06-21: qty 100

## 2019-06-21 MED ORDER — OXYCODONE HCL 5 MG PO TABS: 5.0000 mg | ORAL_TABLET | Freq: Once | ORAL | Status: DC | PRN

## 2019-06-21 MED ORDER — IOHEXOL 300 MG/ML  SOLN: 150.0000 mL | Freq: Once | INTRAMUSCULAR | Status: DC | PRN

## 2019-06-21 MED ORDER — FENTANYL CITRATE (PF) 250 MCG/5ML IJ SOLN
INTRAMUSCULAR | Status: DC | PRN
  Administered 2019-06-21: 100 ug via INTRAVENOUS
  Administered 2019-06-21: 50 ug via INTRAVENOUS
  Administered 2019-06-21 (×2): 25 ug via INTRAVENOUS

## 2019-06-21 MED ORDER — LABETALOL HCL 5 MG/ML IV SOLN
10.0000 mg | INTRAVENOUS | Status: DC | PRN
  Administered 2019-06-21: 20 mg via INTRAVENOUS
  Filled 2019-06-21: qty 4

## 2019-06-21 MED ORDER — LACTATED RINGERS IV SOLN: INTRAVENOUS | Status: DC

## 2019-06-21 MED ORDER — SERTRALINE HCL 50 MG PO TABS: 100.0000 mg | ORAL_TABLET | Freq: Every day | ORAL | Status: DC

## 2019-06-21 MED ORDER — LAMOTRIGINE 100 MG PO TABS: 200.0000 mg | ORAL_TABLET | Freq: Every day | ORAL | Status: DC

## 2019-06-21 MED ORDER — PHENYLEPHRINE 40 MCG/ML (10ML) SYRINGE FOR IV PUSH (FOR BLOOD PRESSURE SUPPORT)
PREFILLED_SYRINGE | INTRAVENOUS | Status: DC | PRN
  Administered 2019-06-21 (×2): 80 ug via INTRAVENOUS

## 2019-06-21 MED ORDER — HYDROCODONE-ACETAMINOPHEN 5-325 MG PO TABS
1.0000 | ORAL_TABLET | ORAL | Status: DC | PRN
  Administered 2019-06-21 – 2019-06-22 (×3): 1 via ORAL
  Filled 2019-06-21 (×3): qty 1

## 2019-06-21 MED ORDER — DEXMEDETOMIDINE HCL 200 MCG/2ML IV SOLN
INTRAVENOUS | Status: DC | PRN
  Administered 2019-06-21: 8 ug via INTRAVENOUS
  Administered 2019-06-21: 12 ug via INTRAVENOUS

## 2019-06-21 MED ORDER — CHLORHEXIDINE GLUCONATE 0.12 % MT SOLN
15.0000 mL | Freq: Once | OROMUCOSAL | Status: AC
  Administered 2019-06-21: 15 mL via OROMUCOSAL
  Filled 2019-06-21: qty 15

## 2019-06-21 MED ORDER — ONDANSETRON HCL 4 MG PO TABS: 4.0000 mg | ORAL_TABLET | ORAL | Status: DC | PRN

## 2019-06-21 MED ORDER — ORAL CARE MOUTH RINSE: 15.0000 mL | Freq: Once | OROMUCOSAL | Status: AC

## 2019-06-21 MED ORDER — DEXAMETHASONE SODIUM PHOSPHATE 10 MG/ML IJ SOLN
INTRAMUSCULAR | Status: DC | PRN
  Administered 2019-06-21: 8 mg via INTRAVENOUS

## 2019-06-21 MED ORDER — CEFAZOLIN SODIUM-DEXTROSE 2-4 GM/100ML-% IV SOLN
2.0000 g | INTRAVENOUS | Status: AC
  Administered 2019-06-21: 2 g via INTRAVENOUS

## 2019-06-21 NOTE — Anesthesia Procedure Notes (Signed)
Procedure Name: Intubation Performed by: Milford Cage, CRNA Pre-anesthesia Checklist: Patient identified, Emergency Drugs available, Suction available and Patient being monitored Patient Re-evaluated:Patient Re-evaluated prior to induction Oxygen Delivery Method: Circle System Utilized Preoxygenation: Pre-oxygenation with 100% oxygen Induction Type: IV induction Ventilation: Mask ventilation without difficulty Laryngoscope Size: Mac and 3 Grade View: Grade I Tube type: Oral Tube size: 7.5 mm Number of attempts: 1 Airway Equipment and Method: Stylet and Oral airway Placement Confirmation: ETT inserted through vocal cords under direct vision,  positive ETCO2 and breath sounds checked- equal and bilateral Secured at: 24 cm Tube secured with: Tape Dental Injury: Teeth and Oropharynx as per pre-operative assessment

## 2019-06-21 NOTE — H&P (Signed)
Chief Complaint   AVM  HPI   HPI: Stephen Terry is a 54 y.o. 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   Given patient's young age and presence of cortical venous reflux, it was recommended he undergo treatment due to the risk of intracranial hemorrhage.  He presents today for onyx embolization of the right dural AV fistula.  He is without any concerns.  Patient Active Problem List   Diagnosis Date Noted  . Cryptogenic stroke ([place]) 05/27/2019  . AVM (arteriovenous malformation) brain 02/22/2019  . Hyperlipidemia 02/22/2019  . ETOH use 02/22/2019  . Overweight 02/22/2019  . 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 Anxiety   . Bipolar disorder ([place])   . Depression   . Hypertension   . Stroke [facility])     PSH: Past Surgical History:  Procedure Laterality Date  . IR ANGIO EXTERNAL CAROTID SEL EXT CAROTID UNI L MOD SED  05/20/2019  . IR ANGIO EXTERNAL CAROTID SEL EXT CAROTID UNI R MOD SED  05/20/2019  . IR ANGIO INTRA EXTRACRAN SEL INTERNAL CAROTID BILAT MOD SED  05/20/2019  . IR ANGIO VERTEBRAL SEL VERTEBRAL BILAT MOD SED  05/20/2019  . LOOP RECORDER INSERTION    . WISDOM TOOTH EXTRACTION      (Not in a hospital admission)   SH: Social History   Tobacco Use  . Smoking status: Heavy Tobacco Smoker    Packs/day: 2.00    Types: Cigarettes  . Smokeless tobacco: Never Used  . Tobacco comment: 1.5-2 packs per day  Substance Use Topics  . Alcohol use: Yes    Alcohol/week: 54.0 standard drinks    Types: 54 Cans of beer per week    Comment: 6-8 per day, bourbon on weekends  . Drug use: Yes    Types: Marijuana    Comment: occassional    MEDS: Prior to Admission medications   Medication Sig Start Date End Date Taking? Authorizing Provider  aspirin EC 81 MG EC tablet Take 1 tablet (81 mg total) by mouth  daily. 02/22/19  Yes Donzetta Starch, NP  atorvastatin (LIPITOR) 80 MG tablet Take 1 tablet (80 mg total) by mouth daily at 6 PM. 02/22/19  Yes Donzetta Starch, NP  lamoTRIgine (LAMICTAL) 200 MG tablet Take 200 mg by mouth daily.   Yes [provider]  propranolol (INDERAL) 20 MG tablet Take 20-40 mg by mouth 3 (three) times daily as needed (For tremers).   Yes [provider]  sertraline (ZOLOFT) 100 MG tablet Take 100 mg by mouth daily.    Yes [provider]    ALLERGY: No Known Allergies  Social History   Tobacco Use  . Smoking status: Heavy Tobacco Smoker    Packs/day: 2.00    Types: Cigarettes  . Smokeless tobacco: Never Used  . Tobacco comment: 1.5-2 packs per day  Substance Use Topics  . Alcohol use: Yes    Alcohol/week: 54.0 standard drinks    Types: 54 Cans of beer per week    Comment: 6-8 per day, bourbon on weekends     No family history on file.   ROS   ROS  Exam   There were no vitals filed for this visit. General appearance: WDWN, NAD Eyes: No scleral injection Cardiovascular: Regular rate and rhythm without murmurs, rubs, gallops. No  edema or variciosities. Distal pulses normal. Pulmonary: Effort normal, non-labored breathing Musculoskeletal:     Muscle tone upper extremities: Normal    Muscle tone lower extremities: Normal    Motor exam: Upper Extremities Deltoid Bicep Tricep Grip  Right 5/5 5/5 5/5 5/5  Left 5/5 5/5 5/5 5/5   Lower Extremity IP Quad PF DF EHL  Right 5/5 5/5 5/5 5/5 5/5  Left 5/5 5/5 5/5 5/5 5/5   Neurological Mental Status:    - Patient is awake, alert, oriented to person, place, month, year, and situation    - Patient is able to give a clear and coherent history.    - No signs of aphasia or neglect Cranial Nerves    - II: Visual Fields are full. PERRL    - III/IV/VI: EOMI without ptosis or diploplia.     - V: Facial sensation is grossly normal    - VII: Facial movement is symmetric.     - VIII:  hearing is intact to voice    - X: Uvula elevates symmetrically    - XI: Shoulder shrug is symmetric.    - XII: tongue is midline without atrophy or fasciculations.  Sensory: Sensation grossly intact to LT  Results - Imaging/Labs   Results for orders placed or performed during the hospital encounter of 06/21/19 (from the past 48 hour(s))  Urinalysis, Routine w reflex microscopic     Status: Abnormal   Collection Time: 06/21/19  8:48 AM  Result Value Ref Range   Color, Urine YELLOW YELLOW   APPearance CLEAR CLEAR   Specific Gravity, Urine 1.021 1.005 - 1.030   pH 6.0 5.0 - 8.0   Glucose, UA NEGATIVE NEGATIVE mg/dL   Hgb urine dipstick NEGATIVE NEGATIVE   Bilirubin Urine NEGATIVE NEGATIVE   Ketones, ur NEGATIVE NEGATIVE mg/dL   Protein, ur 30 (A) NEGATIVE mg/dL   Nitrite NEGATIVE NEGATIVE   Leukocytes,Ua NEGATIVE NEGATIVE   RBC / HPF 0-5 0 - 5 RBC/hpf   WBC, UA 0-5 0 - 5 WBC/hpf   Bacteria, UA FEW (A) NONE SEEN   Mucus PRESENT    Hyaline Casts, UA PRESENT     Comment: Performed at [facility], [address] [place], [place] [address]    No results found.  IMAGING: Diagnostic cerebral angiogram was again reviewed.  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  With his age and the presence of cortical venous reflux I do think that there is a real risk of intracranial hemorrhage.   We will proceed with onyx embolization of the right dural AV fistula.  We have reviewed the indications for treatment, the associated risks, benefits, and alternatives to surgery in detail in the office. All questions today were answered and Stephen Terry provided consent to proceed.

## 2019-06-21 NOTE — Brief Op Note (Signed)
°  NEUROSURGERY BRIEF OPERATIVE  NOTE   PREOP DX: Right dural arterio-venous fistula  POSTOP DX: Same  PROCEDURE: Onyx embolization of right transverse-sigmoid dural AVf  SURGEON: Dr. Lisbeth Renshaw, MD  ANESTHESIA: GETA  EBL: Minimal  SPECIMENS: None  COMPLICATIONS: None  CONDITION: Stable to recovery  FINDINGS (Full report in [facility]): 1. Successful Onyx embolization of two right occipital artery feeders and accessory MMA feeders to transverse-sigmoid fistula. No further filling of the fistula from the right carotid circulation is seen.

## 2019-06-21 NOTE — Transfer of Care (Signed)
Immediate Anesthesia Transfer of Care Note  Patient: GARRON ELINE  Procedure(s) Performed: 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 (Right )  Patient Location: PACU  Anesthesia Type:General  Level of Consciousness: awake  Airway & Oxygen Therapy: Patient Spontanous Breathing and Patient connected to face mask oxygen  Post-op Assessment: Report given to RN and Post -op Vital signs reviewed and stable  Post vital signs: Reviewed and stable  Last Vitals:  Vitals Value Taken Time  BP 119/71 06/21/19 1501  Temp    Pulse 67   Resp 15 06/21/19 1503  SpO2 98   Vitals shown include unvalidated device data.  Last Pain:  Vitals:   06/21/19 0902  PainSc: 0-No pain         Complications: No apparent anesthesia complications

## 2019-06-21 NOTE — Progress Notes (Signed)
  NEUROSURGERY POSTOP NOTE   Pt seen in PACU, doing well. No complaints.  EXAM:  BP (!) 177/96   Pulse 62   Temp 98.6 F (37 C)   Resp 18   Ht 6\' 3"  (1.905 m)   Wt 100.2 kg   SpO2 98%   BMI 27.61 kg/m   Awake, alert Speech fluent, appropriate  CN grossly intact  FC, MAE well  IMPRESSION:  54 y.o. male s/p embolization of right occipital and right accessory MMA feeders to right T-S dural fistula. At neurologic baseline  PLAN: - Admit to ICU for obs - Flat x 4hrs for right femoral access - SBP goal < 44

## 2019-06-21 NOTE — Anesthesia Procedure Notes (Signed)
Arterial Line Insertion Start/End6/01/2019 10:00 AM, 06/21/2019 10:15 AM Performed by: Achille Rich, MD, Ezekiel Ina, CRNA, anesthesiologist  Patient location: Pre-op. Preanesthetic checklist: patient identified, IV checked, site marked, risks and benefits discussed, surgical consent, monitors and equipment checked, pre-op evaluation, timeout performed and anesthesia consent Lidocaine 1% used for infiltration Right, radial was placed Catheter size: 20 G Hand hygiene performed , maximum sterile barriers used  and Seldinger technique used  Attempts: 1 Procedure performed using ultrasound guided technique. Following insertion, dressing applied. Post procedure assessment: normal and unchanged

## 2019-06-21 NOTE — Anesthesia Postprocedure Evaluation (Signed)
Anesthesia Post Note  Patient: Stephen Terry  Procedure(s) Performed: 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 (Right )     Patient location during evaluation: PACU Anesthesia Type: General Level of consciousness: awake and alert Pain management: pain level controlled Vital Signs Assessment: post-procedure vital signs reviewed and stable Respiratory status: spontaneous breathing, nonlabored ventilation, respiratory function stable and patient connected to nasal cannula oxygen Cardiovascular status: blood pressure returned to baseline and stable Postop Assessment: no apparent nausea or vomiting Anesthetic complications: no    Last Vitals:  Vitals:   06/21/19 1730 06/21/19 1745  BP:    Pulse: (!) 57 (!) 55  Resp: 16 16  Temp:    SpO2: 97% 96%    Last Pain:  Vitals:   06/21/19 1619  TempSrc: Oral  PainSc:                  Devi Hopman S

## 2019-06-21 NOTE — Sedation Documentation (Signed)
Patient transported with CRNA to PACU. Patient alert and oriented. Report given to PACU RN. Groin site assessed-no hematoma noted, site soft upon palpation. DP/PT  pulses +2 bilaterally

## 2019-06-22 ENCOUNTER — Encounter: Payer: Self-pay | Admitting: *Deleted

## 2019-06-22 MED ORDER — ASPIRIN 81 MG PO TBEC: 81.0000 mg | DELAYED_RELEASE_TABLET | Freq: Every day | ORAL | 12 refills | Status: DC

## 2019-06-22 NOTE — Discharge Summary (Signed)
°  Physician Discharge Summary  Patient ID: QUAID YEAKLE MRN: [id number] DOB/AGE: 52-Aug-1967 52 y.o.  Admit date: 06/21/2019 Discharge date: 06/22/2019  Admission Diagnoses:  Dural AV fistula  Discharge Diagnoses:  Same Active Problems:   Dural arteriovenous fistula   Discharged Condition: Stable  Hospital Course:  Stephen Terry is a 54 y.o. male who was admitted for the below procedure. There were no post operative complications. At time of discharge, pain was well controlled, ambulating with Pt/OT, tolerating po, voiding normal. Ready for discharge.  Treatments: Surgery  embolization of right occipital and right accessory MMA feeders to right T-S dural fistula  Discharge Exam: Blood pressure 130/79, pulse 65, temperature 98.1 F (36.7 C), temperature source Oral, resp. rate 20, height 6\' 3"  (1.905 m), weight 100.2 kg, SpO2 98 %. Awake, alert, oriented Speech fluent, appropriate CN grossly intact 5/5 BUE/BLE Wound c/d/i  Disposition: Discharge disposition: 01-Home or Self Care       Discharge Instructions    Call MD for:  difficulty breathing, headache or visual disturbances   Complete by: As directed    Call MD for:  persistant dizziness or light-headedness   Complete by: As directed    Call MD for:  redness, tenderness, or signs of infection (pain, swelling, redness, odor or green/yellow discharge around incision site)   Complete by: As directed    Call MD for:  severe uncontrolled pain   Complete by: As directed    Call MD for:  temperature >100.4   Complete by: As directed    Diet general   Complete by: As directed    Driving Restrictions   Complete by: As directed    Do not drive until given clearance.   Increase activity slowly   Complete by: As directed    Lifting restrictions   Complete by: As directed    Do not lift anything >10lbs. Avoid bending and twisting in awkward positions. Avoid bending at the back.   May shower / Bathe   Complete by: As  directed    In 24 hours. Okay to wash wound with warm soapy water. Avoid scrubbing the wound. Pat dry.   Remove dressing in 24 hours   Complete by: As directed      Allergies as of 06/22/2019   No Known Allergies     Medication List    TAKE these medications   aspirin 81 MG EC tablet Take 1 tablet (81 mg total) by mouth daily. Start taking on: June 29, 2019 What changed: These instructions start on June 29, 2019. If you are unsure what to do until then, ask your doctor or other care provider.   atorvastatin 80 MG tablet Commonly known as: LIPITOR Take 1 tablet (80 mg total) by mouth daily at 6 PM.   lamoTRIgine 200 MG tablet Commonly known as: LAMICTAL Take 200 mg by mouth daily.   propranolol 20 MG tablet Commonly known as: INDERAL Take 20-40 mg by mouth 3 (three) times daily as needed (For tremers).   sertraline 100 MG tablet Commonly known as: ZOLOFT Take 100 mg by mouth daily.      Follow-up Information    July 01, 2019, MD. Schedule an appointment as soon as possible for a visit in 2 week(s).   Specialty: Neurosurgery Contact information: [address] [place] [phone]           Signed: [phone] 06/22/2019, 8:24 AM

## 2019-06-22 NOTE — Progress Notes (Signed)
  NEUROSURGERY PROGRESS NOTE   No issues overnight.  No concerns this am Denies headache, dizziness, changes in vision Tolerating po. Voiding normal. Ambulating without difficulties.  Eager for d/c  EXAM:  BP 130/79   Pulse 65   Temp (!) 97.5 F (36.4 C) (Oral)   Resp 20   Ht 6\' 3"  (1.905 m)   Wt 100.2 kg   SpO2 98%   BMI 27.61 kg/m   Awake, alert, oriented  Speech fluent, appropriate  CN grossly intact  5/5 BUE/BLE  No drift  IMPRESSION/PLAN 54 y.o. male POD1 embolization of right occipital and right accessory MMA feeders to right T-S dural fistula. Doing well - d/c home

## 2019-06-27 ENCOUNTER — Ambulatory Visit (INDEPENDENT_AMBULATORY_CARE_PROVIDER_SITE_OTHER): Payer: BC Managed Care – PPO | Admitting: *Deleted

## 2019-06-27 DIAGNOSIS — I639 Cerebral infarction, unspecified: Secondary | ICD-10-CM

## 2019-06-27 LAB — CUP PACEART REMOTE DEVICE CHECK
Date Time Interrogation Session: 20210606230516
Implantable Pulse Generator Implant Date: 20210507

## 2019-06-29 NOTE — Progress Notes (Signed)
Carelink Summary Report / Loop Recorder 

## 2019-07-28 ENCOUNTER — Ambulatory Visit (INDEPENDENT_AMBULATORY_CARE_PROVIDER_SITE_OTHER): Payer: BC Managed Care – PPO | Admitting: *Deleted

## 2019-07-28 DIAGNOSIS — I639 Cerebral infarction, unspecified: Secondary | ICD-10-CM

## 2019-07-28 LAB — CUP PACEART REMOTE DEVICE CHECK
Date Time Interrogation Session: 20210707230410
Implantable Pulse Generator Implant Date: 20210507

## 2019-08-01 ENCOUNTER — Ambulatory Visit: Payer: BC Managed Care – PPO | Admitting: Adult Health

## 2019-08-01 ENCOUNTER — Encounter: Payer: Self-pay | Admitting: Adult Health

## 2019-08-01 NOTE — Progress Notes (Signed)
Carelink Summary Report / Loop Recorder 

## 2019-08-01 NOTE — Progress Notes (Deleted)
[facility] [address] [place]. [place] [address] [phone]       HOSPITAL FOLLOW UP NOTE  Mr. Stephen Terry Date of Birth:  25-Aug-1965 Medical Record Number:  [id number]   Reason for Referral:  hospital stroke follow up    CHIEF COMPLAINT:  No chief complaint on file.   HPI:   Today, 08/01/2019, Stephen Terry returns for stroke follow-up.  Residual deficits of ***.  Denies new or worsening stroke/TIA symptoms.  30-day cardiac event monitor negative for atrial fibrillation therefore ILR placed which has not shown atrial fibrillation thus far.      History provided for reference purposes only Initial visit 03/29/2019 JM: Stephen Terry is a 54 year old male who is being seen today for hospital follow-up. He has been stable from a stroke standpoint with residual mild left facial droop. He does endorse occasional episodes of dizziness/lightheadedness that will only last for a couple minutes and then subside. He has returned back to work without difficulty.  Completed 3 weeks DAPT and continues on aspirin alone without bleeding or bruising.  Continues on atorvastatin without myalgias.  Blood pressure today 125/73. He plans on returning cardiac monitor as tomorrow as recently completed. He has completely stopped ETOH use since discharge as well as improving diet. No further concerns at this time.   Stroke admission 02/20/2019: Stephen Terry presenting on 02/20/2019 withL facial droop and slurred speech.  Evaluated by stroke team and Dr. Pearlean Brownie with stroke work-up revealing right posterior frontal (MCA) infarct status post TPA embolic secondary to unknown source.  Incidental AVM finding and advised to follow-up with neurosurgery outpatient.  Recommended cardiac event monitor outpatient to rule out atrial fibrillation.  Recommended DAPT for 3 weeks and aspirin alone.  LDL 201 initiate atorvastatin 80 mg daily.  No prior  history of HTN or DM.  Other stroke risk factors include EtOH abuse, tobacco use and overweight but no prior history of stroke.  Residual deficits of mild dysarthria and mild left lower facial weakness and discharged home in stable condition without therapy needs.  Stroke:R posterior frontal (MCA) infarct s/p tPA, embolic secondary to unknownsource  Code Stroke CT headR lateral tentorium hyperdensity ? Meningioma. R frontal lobe white matter changes.ASPECTS 10.Sinus dz.  CTA head & neckposterior R tentorial AVM. Sinus dz.  CT perfusionno LVO or infarct  MRIw/w/osmall right posterior frontal infarct. R tentorium AVM.  2D Echonormal ejection fraction 60 to 65%. Mild diastolic dysfunction. No clot.  [id number]  HgbA1c5.1  No antithromboticprior to admission, recommend aspirin 81 and plavix x 3 weeks then aspirin alone  OP cardiac monitoring to look for atrial fibrillation as possible source of stroke  Therapy recommendations:no therapy needs  Disposition:return home      ROS:   14 system review of systems performed and negative with exception of dizziness  PMH:  Past Medical History:  Diagnosis Date  . Anxiety   . Bipolar disorder ([facility])   . Depression   . Hypertension   . Stroke [facility])     PSH:  Past Surgical History:  Procedure Laterality Date  . IR ANGIO EXTERNAL CAROTID SEL EXT CAROTID UNI L MOD SED  05/20/2019  . IR ANGIO EXTERNAL CAROTID SEL EXT CAROTID UNI R MOD SED  05/20/2019  . IR ANGIO EXTERNAL CAROTID SEL EXT CAROTID UNI R MOD SED  06/21/2019  . IR ANGIO INTRA EXTRACRAN SEL INTERNAL CAROTID BILAT MOD SED  05/20/2019  . IR ANGIO INTRA EXTRACRAN  SEL INTERNAL CAROTID UNI R MOD SED  06/21/2019  . IR ANGIO VERTEBRAL SEL VERTEBRAL BILAT MOD SED  05/20/2019  . IR ANGIOGRAM FOLLOW UP STUDY  06/21/2019  . IR NEURO EACH ADD'L AFTER BASIC UNI RIGHT (MS)  06/21/2019  . IR TRANSCATH/EMBOLIZ  06/21/2019  . LOOP RECORDER INSERTION    . RADIOLOGY WITH ANESTHESIA  Right 06/21/2019   Procedure: 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;  Surgeon: Lisbeth Renshaw, MD;  Location: [facility] OR;  Service: Radiology;  Laterality: Right;  . WISDOM TOOTH EXTRACTION      Social History:  Social History   Socioeconomic History  . Marital status: Legally Separated    Spouse name: Not on file  . Number of children: Not on file  . Years of education: Not on file  . Highest education level: Not on file  Occupational History  . Not on file  Tobacco Use  . Smoking status: Heavy Tobacco Smoker    Packs/day: 2.00    Types: Cigarettes  . Smokeless tobacco: Never Used  . Tobacco comment: 1.5-2 packs per day  Vaping Use  . Vaping Use: Never used  Substance and Sexual Activity  . Alcohol use: Yes    Alcohol/week: 54.0 standard drinks    Types: 54 Cans of beer per week    Comment: 6-8 per day, bourbon on weekends  . Drug use: Yes    Types: Marijuana    Comment: occassional  . Sexual activity: Not on file  Other Topics Concern  . Not on file  Social History Narrative  . Not on file   Social Determinants of Health   Financial Resource Strain:   . Difficulty of Paying Living Expenses:   Food Insecurity:   . Worried About Programme researcher, broadcasting/film/video in the Last Year:   . Barista in the Last Year:   Transportation Needs:   . Freight forwarder (Medical):   Marland Kitchen Lack of Transportation (Non-Medical):   Physical Activity:   . Days of Exercise per Week:   . Minutes of Exercise per Session:   Stress:   . Feeling of Stress :   Social Connections:   . Frequency of Communication with Friends and Family:   . Frequency of Social Gatherings with Friends and Family:   . Attends Religious Services:   . Active Member of Clubs or Organizations:   . Attends Banker Meetings:   Marland Kitchen Marital Status:   Intimate Partner Violence:   . Fear of Current or Ex-Partner:   . Emotionally Abused:   Marland Kitchen Physically Abused:   . Sexually Abused:     Family History:  No family history on file.  Medications:   Current Outpatient Medications on File Prior to Visit  Medication Sig Dispense Refill  . aspirin 81 MG EC tablet Take 1 tablet (81 mg total) by mouth daily. 30 tablet 12  . atorvastatin (LIPITOR) 80 MG tablet Take 1 tablet (80 mg total) by mouth daily at 6 PM. 30 tablet 2  . lamoTRIgine (LAMICTAL) 200 MG tablet Take 200 mg by mouth daily.    . propranolol (INDERAL) 20 MG tablet Take 20-40 mg by mouth 3 (three) times daily as needed (For tremers).    . sertraline (ZOLOFT) 100 MG tablet Take 100 mg by mouth daily.      No current facility-administered medications on file prior to visit.    Allergies:  No Known Allergies   Physical Exam  There were no  vitals filed for this visit. There is no height or weight on file to calculate BMI. No exam data present  No flowsheet data found.   General: well developed, well nourished,  pleasant middle-age Caucasian male, seated, in no evident distress Head: head normocephalic and atraumatic.   Neck: supple with no carotid or supraclavicular bruits Cardiovascular: regular rate and rhythm, no murmurs Musculoskeletal: no deformity Skin:  no rash/petichiae Vascular:  Normal pulses all extremities   Neurologic Exam Mental Status: Awake and fully alert.   Normal speech and language.  Oriented to place and time. Recent and remote memory intact. Attention span, concentration and fund of knowledge appropriate. Mood and affect appropriate.  Cranial Nerves: Fundoscopic exam reveals sharp disc margins. Pupils equal, briskly reactive to light. Extraocular movements full without nystagmus. Visual fields full to confrontation. Hearing intact. Facial sensation intact.  Mild left lower facial weakness. Motor: Normal bulk and tone. Normal strength in all tested extremity muscles. Sensory.: intact to touch , pinprick , position and vibratory sensation.  Coordination: Rapid alternating movements normal in all extremities.  Finger-to-nose and heel-to-shin performed accurately bilaterally. Gait and Station: Arises from chair without difficulty. Stance is normal. Gait demonstrates normal stride length and balance Reflexes: 1+ and symmetric. Toes downgoing.      ASSESSMENT: Stephen Terry is a 42 y.o. year old male presented with left facial droop and slurred speech on 02/20/2019 with stroke work-up revealing right posterior frontal infarct status post TPA embolic secondary to unknown source.  Recommended cardiac event monitor outpatient recently completed and will be sending back to cardiology tomorrow.  Vascular risk factors include incidental finding of likely dural AVM, HLD, EtOH use and tobacco use.  Recovered well from a stroke standpoint with residual mild left lower facial weakness    PLAN:  1. Right MCA stroke: Continue aspirin 81 mg daily  and atorvastatin for secondary stroke prevention.  Advised to send back cardiac event monitor to cardiology tomorrow to rule out atrial fibrillation as potential cause of stroke.  Maintain strict control of hypertension with blood pressure goal below 130/90, diabetes with hemoglobin A1c goal below 6.5% and cholesterol with LDL cholesterol (bad cholesterol) goal below 70 mg/dL.  I also advised the patient to eat a healthy diet with plenty of whole grains, cereals, fruits and vegetables, exercise regularly with at least 30 minutes of continuous activity daily and maintain ideal body weight. 2. HLD: Advised to continue current treatment regimen along with continued follow-up with PCP for future prescribing and monitoring of lipid panel 3. 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  Referral placed for evaluation by Dr. Conchita Paris as recommended by Dr. Pearlean Brownie    Follow up in 4 months or call earlier if needed   Greater than 50% of time during this 45 minute visit was spent on counseling, explanation of diagnosis of right MCA stroke,  reviewing risk factor management of HLD and likely dural AVM, planning of further management along with potential future management, and discussion with patient and family answering all questions.    Ihor Austin, AGNP-BC  [facility] [address] [place], [place] [address]  Phone [phone] Fax [phone] Note: This document was prepared with digital dictation and possible smart phrase technology. Any transcriptional errors that result from this process are unintentional.

## 2019-08-29 ENCOUNTER — Ambulatory Visit (INDEPENDENT_AMBULATORY_CARE_PROVIDER_SITE_OTHER): Payer: BC Managed Care – PPO | Admitting: *Deleted

## 2019-08-29 DIAGNOSIS — I639 Cerebral infarction, unspecified: Secondary | ICD-10-CM | POA: Diagnosis not present

## 2019-08-31 LAB — CUP PACEART REMOTE DEVICE CHECK
Date Time Interrogation Session: 20210809230021
Implantable Pulse Generator Implant Date: 20210507

## 2019-09-01 NOTE — Progress Notes (Signed)
Carelink Summary Report / Loop Recorder 

## 2019-09-29 ENCOUNTER — Ambulatory Visit (INDEPENDENT_AMBULATORY_CARE_PROVIDER_SITE_OTHER): Payer: BC Managed Care – PPO | Admitting: *Deleted

## 2019-09-29 DIAGNOSIS — I639 Cerebral infarction, unspecified: Secondary | ICD-10-CM

## 2019-10-02 LAB — CUP PACEART REMOTE DEVICE CHECK
Date Time Interrogation Session: 20210911230349
Implantable Pulse Generator Implant Date: 20210507

## 2019-10-04 NOTE — Progress Notes (Signed)
Carelink Summary Report / Loop Recorder 

## 2019-10-31 ENCOUNTER — Ambulatory Visit (INDEPENDENT_AMBULATORY_CARE_PROVIDER_SITE_OTHER): Payer: BC Managed Care – PPO

## 2019-10-31 DIAGNOSIS — I639 Cerebral infarction, unspecified: Secondary | ICD-10-CM | POA: Diagnosis not present

## 2019-11-04 LAB — CUP PACEART REMOTE DEVICE CHECK
Date Time Interrogation Session: 20211014230252
Implantable Pulse Generator Implant Date: 20210507

## 2019-11-15 NOTE — Progress Notes (Signed)
Carelink Summary Report / Loop Recorder 

## 2019-12-01 ENCOUNTER — Ambulatory Visit (INDEPENDENT_AMBULATORY_CARE_PROVIDER_SITE_OTHER): Payer: BC Managed Care – PPO

## 2019-12-01 DIAGNOSIS — I639 Cerebral infarction, unspecified: Secondary | ICD-10-CM | POA: Diagnosis not present

## 2019-12-01 LAB — CUP PACEART REMOTE DEVICE CHECK
Date Time Interrogation Session: 20211110230543
Implantable Pulse Generator Implant Date: 20210507

## 2019-12-05 NOTE — Progress Notes (Signed)
Carelink Summary Report / Loop Recorder 

## 2019-12-22 ENCOUNTER — Other Ambulatory Visit: Payer: Self-pay | Admitting: Neurosurgery

## 2019-12-22 DIAGNOSIS — I671 Cerebral aneurysm, nonruptured: Secondary | ICD-10-CM

## 2019-12-30 ENCOUNTER — Inpatient Hospital Stay (HOSPITAL_COMMUNITY)
Admission: RE | Admit: 2019-12-30 | Discharge: 2019-12-30 | Disposition: A | Discharge status: Home / Self Care | Payer: BC Managed Care – PPO | Source: Ambulatory Visit

## 2019-12-30 NOTE — Progress Notes (Signed)
Pt not tested for covid today 12/10 due to pt testing + for covid on Oct. 5, 2021 (results in Care Everywhere). Based on the guidelines the pt is in the 90 day window to not retest. The pt is still expected to quarantine until their procedure, and the pt can still have the scheduled procedure.   These are the guidelines as follows:  Guidance: Patient previously tested + COVID; now past 90 day window seeking elective surgery (asymptomatic)  Retest patient If negative, proceed with surgery If positive, postpone surgery for 10 days from positive test Patient to quarantine for the (10 days) Do not retest again prior to surgery (even if scheduled a couple of weeks out) Use standard precautions for surgery

## 2020-01-02 ENCOUNTER — Ambulatory Visit (INDEPENDENT_AMBULATORY_CARE_PROVIDER_SITE_OTHER): Payer: BC Managed Care – PPO

## 2020-01-02 DIAGNOSIS — I639 Cerebral infarction, unspecified: Secondary | ICD-10-CM

## 2020-01-03 ENCOUNTER — Ambulatory Visit (HOSPITAL_COMMUNITY)
Admission: RE | Admit: 2020-01-03 | Discharge: 2020-01-03 | Disposition: A | Discharge status: Home / Self Care | Payer: BC Managed Care – PPO | Source: Ambulatory Visit | Attending: Neurosurgery | Admitting: Neurosurgery

## 2020-01-03 ENCOUNTER — Other Ambulatory Visit: Payer: Self-pay | Admitting: Neurosurgery

## 2020-01-03 ENCOUNTER — Other Ambulatory Visit: Payer: Self-pay

## 2020-01-03 DIAGNOSIS — F17210 Nicotine dependence, cigarettes, uncomplicated: Secondary | ICD-10-CM | POA: Insufficient documentation

## 2020-01-03 DIAGNOSIS — I671 Cerebral aneurysm, nonruptured: Secondary | ICD-10-CM | POA: Diagnosis present

## 2020-01-03 DIAGNOSIS — Z7982 Long term (current) use of aspirin: Secondary | ICD-10-CM | POA: Diagnosis not present

## 2020-01-03 DIAGNOSIS — Z79899 Other long term (current) drug therapy: Secondary | ICD-10-CM | POA: Diagnosis not present

## 2020-01-03 HISTORY — PX: IR ANGIO INTRA EXTRACRAN SEL INTERNAL CAROTID BILAT MOD SED: IMG5363

## 2020-01-03 HISTORY — PX: IR ANGIO EXTERNAL CAROTID SEL EXT CAROTID BILAT MOD SED: IMG5372

## 2020-01-03 HISTORY — PX: IR ANGIO VERTEBRAL SEL VERTEBRAL BILAT MOD SED: IMG5369

## 2020-01-03 HISTORY — PX: IR NEURO EACH ADD'L AFTER BASIC UNI RIGHT (MS): IMG5374

## 2020-01-03 LAB — BASIC METABOLIC PANEL WITH GFR
Anion gap: 13 (ref 5–15)
BUN: 9 mg/dL (ref 6–20)
CO2: 19 mmol/L — ABNORMAL LOW (ref 22–32)
Calcium: 8.8 mg/dL — ABNORMAL LOW (ref 8.9–10.3)
Chloride: 104 mmol/L (ref 98–111)
Creatinine, Ser: 0.81 mg/dL (ref 0.61–1.24)
GFR, Estimated: >60 mL/min
Glucose, Bld: 92 mg/dL (ref 70–99)
Potassium: 4.3 mmol/L (ref 3.5–5.1)
Sodium: 136 mmol/L (ref 135–145)

## 2020-01-03 LAB — CBC WITH DIFFERENTIAL/PLATELET
Abs Immature Granulocytes: 0.05 10*3/uL (ref 0.00–0.07)
Basophils Absolute: 0.1 10*3/uL (ref 0.0–0.1)
Basophils Relative: 1 %
Eosinophils Absolute: 0.6 10*3/uL — ABNORMAL HIGH (ref 0.0–0.5)
Eosinophils Relative: 6 %
HCT: 48.8 % (ref 39.0–52.0)
Hemoglobin: 16.1 g/dL (ref 13.0–17.0)
Immature Granulocytes: 1 %
Lymphocytes Relative: 20 %
Lymphs Abs: 2.1 10*3/uL (ref 0.7–4.0)
MCH: 31.7 pg (ref 26.0–34.0)
MCHC: 33 g/dL (ref 30.0–36.0)
MCV: 96.1 fL (ref 80.0–100.0)
Monocytes Absolute: 0.8 10*3/uL (ref 0.1–1.0)
Monocytes Relative: 8 %
Neutro Abs: 6.9 10*3/uL (ref 1.7–7.7)
Neutrophils Relative %: 64 %
Platelets: 244 10*3/uL (ref 150–400)
RBC: 5.08 MIL/uL (ref 4.22–5.81)
RDW: 12.5 % (ref 11.5–15.5)
WBC: 10.5 10*3/uL (ref 4.0–10.5)
nRBC: 0 % (ref 0.0–0.2)

## 2020-01-03 LAB — CUP PACEART REMOTE DEVICE CHECK
Date Time Interrogation Session: 20211213230333
Implantable Pulse Generator Implant Date: 20210507

## 2020-01-03 LAB — PROTIME-INR
INR: 1 (ref 0.8–1.2)
Prothrombin Time: 12.7 seconds (ref 11.4–15.2)

## 2020-01-03 LAB — APTT: aPTT: 27 seconds (ref 24–36)

## 2020-01-03 MED ORDER — HEPARIN SODIUM (PORCINE) 1000 UNIT/ML IJ SOLN
INTRAMUSCULAR | Status: AC | PRN
  Administered 2020-01-03: 2000 [IU] via INTRAVENOUS

## 2020-01-03 MED ORDER — CEFAZOLIN SODIUM-DEXTROSE 2-4 GM/100ML-% IV SOLN: 2.0000 g | INTRAVENOUS | Status: DC

## 2020-01-03 MED ORDER — IOHEXOL 300 MG/ML  SOLN
100.0000 mL | Freq: Once | INTRAMUSCULAR | Status: AC | PRN
  Administered 2020-01-03: 15:00:00 50 mL via INTRA_ARTERIAL

## 2020-01-03 MED ORDER — HEPARIN SODIUM (PORCINE) 1000 UNIT/ML IJ SOLN
INTRAMUSCULAR | Status: AC
  Filled 2020-01-03: qty 1

## 2020-01-03 MED ORDER — SODIUM CHLORIDE 0.9 % IV SOLN: INTRAVENOUS | Status: DC

## 2020-01-03 MED ORDER — CHLORHEXIDINE GLUCONATE CLOTH 2 % EX PADS: 6.0000 | MEDICATED_PAD | Freq: Once | CUTANEOUS | Status: DC

## 2020-01-03 MED ORDER — FENTANYL CITRATE (PF) 100 MCG/2ML IJ SOLN
INTRAMUSCULAR | Status: AC
  Filled 2020-01-03: qty 2

## 2020-01-03 MED ORDER — LIDOCAINE HCL (PF) 1 % IJ SOLN
INTRAMUSCULAR | Status: AC | PRN
  Administered 2020-01-03: 10 mL

## 2020-01-03 MED ORDER — MIDAZOLAM HCL 2 MG/2ML IJ SOLN
INTRAMUSCULAR | Status: AC
  Filled 2020-01-03: qty 2

## 2020-01-03 MED ORDER — FENTANYL CITRATE (PF) 100 MCG/2ML IJ SOLN
INTRAMUSCULAR | Status: AC | PRN
Start: 2020-01-03 — End: 2020-01-03
  Administered 2020-01-03: 25 ug via INTRAVENOUS

## 2020-01-03 MED ORDER — IOHEXOL 300 MG/ML  SOLN
50.0000 mL | Freq: Once | INTRAMUSCULAR | Status: AC | PRN
  Administered 2020-01-03: 15:00:00 25 mL via INTRA_ARTERIAL

## 2020-01-03 MED ORDER — HYDROCODONE-ACETAMINOPHEN 5-325 MG PO TABS: 1.0000 | ORAL_TABLET | ORAL | Status: DC | PRN

## 2020-01-03 MED ORDER — LIDOCAINE HCL 1 % IJ SOLN
INTRAMUSCULAR | Status: AC
  Filled 2020-01-03: qty 20

## 2020-01-03 MED ORDER — MIDAZOLAM HCL 2 MG/2ML IJ SOLN
INTRAMUSCULAR | Status: AC | PRN
  Administered 2020-01-03: 1 mg via INTRAVENOUS

## 2020-01-03 NOTE — Sedation Documentation (Signed)
Right groin sheath removed, 54fr exoseal closure device deployed to right groin.

## 2020-01-03 NOTE — H&P (Signed)
Chief Complaint   No chief complaint on file.   HPI   HPI: Stephen Terry is a 54 y.o. male who underwent onyx embolization of right AV dural fistula approximately 6 months ago. He had an uncomplicated hospital course and was discharged the following day. He has no recurrence in preoperative pulsatile tinnitus. He presents today for routine follow up diagnostic cerebral angiogram for continued surveillance. He is without any concerns today.  Patient Active Problem List   Diagnosis Date Noted  . 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 Cryptogenic stroke (HCC) 05/27/2019  . AVM (arteriovenous malformation) brain 02/22/2019  . Hyperlipidemia 02/22/2019  . ETOH use 02/22/2019  . Overweight 02/22/2019  . 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 Anxiety   . Bipolar disorder (HCC)   . Depression   . Hypertension   . Stroke [facility])     PSH: Past Surgical History:  Procedure Laterality Date  . IR ANGIO EXTERNAL CAROTID SEL EXT CAROTID UNI L MOD SED  05/20/2019  . IR ANGIO EXTERNAL CAROTID SEL EXT CAROTID UNI R MOD SED  05/20/2019  . IR ANGIO EXTERNAL CAROTID SEL EXT CAROTID UNI R MOD SED  06/21/2019  . IR ANGIO INTRA EXTRACRAN SEL INTERNAL CAROTID BILAT MOD SED  05/20/2019  . IR ANGIO INTRA EXTRACRAN SEL INTERNAL CAROTID UNI R MOD SED  06/21/2019  . IR ANGIO VERTEBRAL SEL VERTEBRAL BILAT MOD SED  05/20/2019  . IR ANGIOGRAM FOLLOW UP STUDY  06/21/2019  . IR NEURO EACH ADD'L AFTER BASIC UNI RIGHT (MS)  06/21/2019  . IR TRANSCATH/EMBOLIZ  06/21/2019  . LOOP RECORDER INSERTION    . RADIOLOGY WITH ANESTHESIA Right 06/21/2019   Procedure: 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;  Surgeon: Lisbeth Renshaw, MD;  Location: [facility];  Service: Radiology;  Laterality: Right;  . WISDOM TOOTH EXTRACTION      (Not in a hospital admission)   SH: Social History   Tobacco Use  . Smoking status:  Heavy Tobacco Smoker    Packs/day: 2.00    Types: Cigarettes  . Smokeless tobacco: Never Used  . Tobacco comment: 1.5-2 packs per day  Vaping Use  . Vaping Use: Never used  Substance Use Topics  . Alcohol use: Yes    Alcohol/week: 54.0 standard drinks    Types: 54 Cans of beer per week    Comment: 6-8 per day, bourbon on weekends  . Drug use: Yes    Types: Marijuana    Comment: occassional    MEDS: Prior to Admission medications   Medication Sig Start Date End Date Taking? Authorizing Provider  aspirin 81 MG EC tablet Take 1 tablet (81 mg total) by mouth daily. 06/29/19  Yes Costella, Darci Current, PA-C  atorvastatin (LIPITOR) 80 MG tablet Take 1 tablet (80 mg total) by mouth daily at 6 PM. 02/22/19  Yes Biby, Jani Files, NP  fluticasone (FLONASE) 50 MCG/ACT nasal spray Place 1 spray into both nostrils daily as needed for allergies or rhinitis.   Yes [provider]  lamoTRIgine (LAMICTAL) 200 MG tablet Take 200 mg by mouth daily.   Yes [provider]  propranolol (INDERAL) 20 MG tablet Take 20-40 mg by mouth 3 (three) times daily as needed (For tremers).   Yes [provider]  sertraline (ZOLOFT) 100 MG tablet Take 100 mg by mouth daily.    Yes [provider]  ALLERGY: No Known Allergies  Social History   Tobacco Use  . Smoking status: Heavy Tobacco Smoker    Packs/day: 2.00    Types: Cigarettes  . Smokeless tobacco: Never Used  . Tobacco comment: 1.5-2 packs per day  Substance Use Topics  . Alcohol use: Yes    Alcohol/week: 54.0 standard drinks    Types: 54 Cans of beer per week    Comment: 6-8 per day, bourbon on weekends     No family history on file.   ROS   ROS  Exam   There were no vitals filed for this visit. General appearance: WDWN, NAD  - Motor: 1= No motor, 2- extensor response, 3- spastic flexion, 4- withdraws from pain, 5= purposeful movement to pain, 6= obeys command Eyes: No scleral injection Cardiovascular:  Regular rate and rhythm without murmurs, rubs, gallops. No edema or variciosities. Distal pulses normal. Pulmonary: Effort normal, non-labored breathing Musculoskeletal:     Muscle tone upper extremities: Normal    Muscle tone lower extremities: Normal    Motor exam: Upper Extremities Deltoid Bicep Tricep Grip  Right 5/5 5/5 5/5 5/5  Left 5/5 5/5 5/5 5/5   Lower Extremity IP Quad PF DF EHL  Right 5/5 5/5 5/5 5/5 5/5  Left 5/5 5/5 5/5 5/5 5/5   Neurological Mental Status:    - Patient is awake, alert, oriented to person, place, month, year, and situation    - Patient is able to give a clear and coherent history.    - No signs of aphasia or neglect Cranial Nerves    - II: Visual Fields are full. PERRL    - III/IV/VI: EOMI without ptosis or diploplia.     - V: Facial sensation is grossly normal    - VII: Facial movement is symmetric.     - VIII: hearing is intact to voice    - X: Uvula elevates symmetrically    - XI: Shoulder shrug is symmetric.    - XII: tongue is midline without atrophy or fasciculations.  Sensory: Sensation grossly intact to LT  Results - Imaging/Labs   No results found for this or any previous visit (from the past 48 hour(s)).  No results found.  Impression/Plan   54 y.o. male approximately 6 months status post onyx embolization of right AV dural fistula. He is doing well. We will proceed with routine follow up diagnostic cerebral angiogram for continued surveillance.  We have reviewed the indications for surgery, the associated risks, benefits and alternatives at length in the office.  All questions today were answered and consent was obtained.  Lisbeth Renshaw, MD [facility]

## 2020-01-03 NOTE — Brief Op Note (Signed)
  NEUROSURGERY BRIEF OPERATIVE  NOTE   PREOP DX: Right transverse-sigmoid dural AVF  POSTOP DX: Same  PROCEDURE: Diagnostic cerebral angiogram  SURGEON: Dr. Lisbeth Renshaw, MD  ANESTHESIA: IV Sedation with Local  EBL: Minimal  SPECIMENS: None  COMPLICATIONS: None  CONDITION: Stable to recovery  FINDINGS (Full report in CanopyPACS): 1. Recanalization of previously embolized right T-S dAVf through feeders from the right occipital, posterior auricular, left occipital, and left posterior meningeal arteries. Drainage is through a posterior fossa vein into the vein of Galen/Straight sinus.

## 2020-01-03 NOTE — Progress Notes (Signed)
Patient was given discharge instructions. He verbalized understanding. 

## 2020-01-03 NOTE — Discharge Instructions (Signed)
Femoral Site Care This sheet gives you information about how to care for yourself after your procedure. Your health care provider may also give you more specific instructions. If you have problems or questions, contact your health care provider. What can I expect after the procedure? After the procedure, it is common to have:  Bruising that usually fades within 1-2 weeks.  Tenderness at the site. Follow these instructions at home: Wound care  Follow instructions from your health care provider about how to take care of your insertion site. Make sure you: ? Wash your hands with soap and water before you change your bandage (dressing). If soap and water are not available, use hand sanitizer. ? Change your dressing as told by your health care provider. ? Leave stitches (sutures), skin glue, or adhesive strips in place. These skin closures may need to stay in place for 2 weeks or longer. If adhesive strip edges start to loosen and curl up, you may trim the loose edges. Do not remove adhesive strips completely unless your health care provider tells you to do that.  Do not take baths, swim, or use a hot tub until your health care provider approves.  You may shower 24-48 hours after the procedure or as told by your health care provider. ? Gently wash the site with plain soap and water. ? Pat the area dry with a clean towel. ? Do not rub the site. This may cause bleeding.  Do not apply powder or lotion to the site. Keep the site clean and dry.  Check your femoral site every day for signs of infection. Check for: ? Redness, swelling, or pain. ? Fluid or blood. ? Warmth. ? Pus or a bad smell. Activity  For the first 2-3 days after your procedure, or as long as directed: ? Avoid climbing stairs as much as possible. ? Do not squat.  Do not lift anything that is heavier than 10 lb (4.5 kg), or the limit that you are told, until your health care provider says that it is safe.  Rest as  directed. ? Avoid sitting for a long time without moving. Get up to take short walks every 1-2 hours.  Do not drive for 24 hours if you were given a medicine to help you relax (sedative). General instructions  Take over-the-counter and prescription medicines only as told by your health care provider.  Keep all follow-up visits as told by your health care provider. This is important. Contact a health care provider if you have:  A fever or chills.  You have redness, swelling, or pain around your insertion site. Get help right away if:  The catheter insertion area swells very fast.  You pass out.  You suddenly start to sweat or your skin gets clammy.  The catheter insertion area is bleeding, and the bleeding does not stop when you hold steady pressure on the area.  The area near or just beyond the catheter insertion site becomes pale, cool, tingly, or numb. These symptoms may represent a serious problem that is an emergency. Do not wait to see if the symptoms will go away. Get medical help right away. Call your local emergency services (911 in the [place] Do not drive yourself to the hospital. Summary  After the procedure, it is common to have bruising that usually fades within 1-2 weeks.  Check your femoral site every day for signs of infection.  Do not lift anything that is heavier than 10 lb (4.5 kg), or the   limit that you are told, until your health care provider says that it is safe. This information is not intended to replace advice given to you by your health care provider. Make sure you discuss any questions you have with your health care provider. Document Revised: 01/19/2017 Document Reviewed: 01/19/2017 Elsevier Patient Education  2020 Elsevier Inc[employer]  

## 2020-01-10 ENCOUNTER — Encounter (HOSPITAL_COMMUNITY): Payer: Self-pay

## 2020-01-10 ENCOUNTER — Other Ambulatory Visit: Payer: Self-pay | Admitting: Neurosurgery

## 2020-01-10 DIAGNOSIS — I671 Cerebral aneurysm, nonruptured: Secondary | ICD-10-CM

## 2020-01-17 NOTE — Progress Notes (Signed)
Carelink Summary Report / Loop Recorder 

## 2020-02-02 ENCOUNTER — Ambulatory Visit (INDEPENDENT_AMBULATORY_CARE_PROVIDER_SITE_OTHER): Payer: Medicaid Other

## 2020-02-02 DIAGNOSIS — I639 Cerebral infarction, unspecified: Secondary | ICD-10-CM

## 2020-02-06 LAB — CUP PACEART REMOTE DEVICE CHECK
Date Time Interrogation Session: 20220115230557
Implantable Pulse Generator Implant Date: 20210507

## 2020-02-16 NOTE — Progress Notes (Signed)
Carelink Summary Report / Loop Recorder 

## 2020-03-05 ENCOUNTER — Ambulatory Visit (INDEPENDENT_AMBULATORY_CARE_PROVIDER_SITE_OTHER): Payer: Medicaid Other

## 2020-03-05 DIAGNOSIS — I639 Cerebral infarction, unspecified: Secondary | ICD-10-CM

## 2020-03-09 LAB — CUP PACEART REMOTE DEVICE CHECK
Date Time Interrogation Session: 20220217230450
Implantable Pulse Generator Implant Date: 20210507

## 2020-03-12 NOTE — Progress Notes (Signed)
Carelink Summary Report / Loop Recorder 

## 2020-04-11 ENCOUNTER — Ambulatory Visit (INDEPENDENT_AMBULATORY_CARE_PROVIDER_SITE_OTHER): Payer: Medicaid Other

## 2020-04-11 DIAGNOSIS — I639 Cerebral infarction, unspecified: Secondary | ICD-10-CM

## 2020-04-11 LAB — CUP PACEART REMOTE DEVICE CHECK
Date Time Interrogation Session: 20220322230311
Implantable Pulse Generator Implant Date: 20210507

## 2020-04-23 NOTE — Progress Notes (Signed)
Carelink Summary Report / Loop Recorder 

## 2020-05-14 ENCOUNTER — Ambulatory Visit (INDEPENDENT_AMBULATORY_CARE_PROVIDER_SITE_OTHER): Payer: Medicaid Other

## 2020-05-14 DIAGNOSIS — I639 Cerebral infarction, unspecified: Secondary | ICD-10-CM

## 2020-05-15 LAB — CUP PACEART REMOTE DEVICE CHECK
Date Time Interrogation Session: 20220424230406
Implantable Pulse Generator Implant Date: 20210507

## 2020-05-31 NOTE — Progress Notes (Signed)
Carelink Summary Report / Loop Recorder 

## 2020-06-18 LAB — CUP PACEART REMOTE DEVICE CHECK
Date Time Interrogation Session: 20220527230232
Implantable Pulse Generator Implant Date: 20210507

## 2020-06-19 ENCOUNTER — Ambulatory Visit (INDEPENDENT_AMBULATORY_CARE_PROVIDER_SITE_OTHER): Payer: Medicaid Other

## 2020-06-19 DIAGNOSIS — I639 Cerebral infarction, unspecified: Secondary | ICD-10-CM | POA: Diagnosis not present

## 2020-07-12 NOTE — Progress Notes (Signed)
Carelink Summary Report / Loop Recorder 

## 2020-07-24 ENCOUNTER — Ambulatory Visit (INDEPENDENT_AMBULATORY_CARE_PROVIDER_SITE_OTHER): Payer: Medicaid Other

## 2020-07-24 DIAGNOSIS — I639 Cerebral infarction, unspecified: Secondary | ICD-10-CM | POA: Diagnosis not present

## 2020-07-26 LAB — CUP PACEART REMOTE DEVICE CHECK
Date Time Interrogation Session: 20220629230442
Implantable Pulse Generator Implant Date: 20210507

## 2020-08-13 NOTE — Progress Notes (Signed)
Carelink Summary Report / Loop Recorder 

## 2020-08-23 LAB — CUP PACEART REMOTE DEVICE CHECK
Date Time Interrogation Session: 20220801230203
Implantable Pulse Generator Implant Date: 20210507

## 2020-08-27 ENCOUNTER — Ambulatory Visit (INDEPENDENT_AMBULATORY_CARE_PROVIDER_SITE_OTHER): Payer: Medicaid Other

## 2020-08-27 DIAGNOSIS — I639 Cerebral infarction, unspecified: Secondary | ICD-10-CM | POA: Diagnosis not present

## 2020-09-20 NOTE — Progress Notes (Signed)
Carelink Summary Report / Loop Recorder 

## 2020-10-01 ENCOUNTER — Ambulatory Visit (INDEPENDENT_AMBULATORY_CARE_PROVIDER_SITE_OTHER): Payer: Medicaid Other

## 2020-10-01 DIAGNOSIS — I639 Cerebral infarction, unspecified: Secondary | ICD-10-CM | POA: Diagnosis not present

## 2020-10-03 LAB — CUP PACEART REMOTE DEVICE CHECK
Date Time Interrogation Session: 20220903230844
Implantable Pulse Generator Implant Date: 20210507

## 2020-10-09 NOTE — Progress Notes (Signed)
Carelink Summary Report / Loop Recorder 

## 2020-11-05 ENCOUNTER — Ambulatory Visit (INDEPENDENT_AMBULATORY_CARE_PROVIDER_SITE_OTHER): Payer: Medicaid Other

## 2020-11-05 DIAGNOSIS — I639 Cerebral infarction, unspecified: Secondary | ICD-10-CM | POA: Diagnosis not present

## 2020-11-07 LAB — CUP PACEART REMOTE DEVICE CHECK
Date Time Interrogation Session: 20221006235828
Implantable Pulse Generator Implant Date: 20210507

## 2020-11-14 NOTE — Progress Notes (Signed)
Carelink Summary Report / Loop Recorder 

## 2020-12-07 ENCOUNTER — Other Ambulatory Visit: Payer: Self-pay | Admitting: Neurosurgery

## 2020-12-07 DIAGNOSIS — I671 Cerebral aneurysm, nonruptured: Secondary | ICD-10-CM

## 2020-12-10 ENCOUNTER — Ambulatory Visit (INDEPENDENT_AMBULATORY_CARE_PROVIDER_SITE_OTHER): Payer: Medicaid Other

## 2020-12-10 DIAGNOSIS — I639 Cerebral infarction, unspecified: Secondary | ICD-10-CM

## 2020-12-11 LAB — CUP PACEART REMOTE DEVICE CHECK
Date Time Interrogation Session: 20221120231147
Implantable Pulse Generator Implant Date: 20210507

## 2020-12-19 ENCOUNTER — Other Ambulatory Visit: Payer: Self-pay | Admitting: Neurosurgery

## 2020-12-19 NOTE — Progress Notes (Signed)
Carelink Summary Report / Loop Recorder 

## 2021-01-01 ENCOUNTER — Other Ambulatory Visit: Payer: Self-pay | Admitting: Neurosurgery

## 2021-01-01 ENCOUNTER — Other Ambulatory Visit: Payer: Self-pay

## 2021-01-01 ENCOUNTER — Ambulatory Visit (HOSPITAL_COMMUNITY)
Admission: RE | Admit: 2021-01-01 | Discharge: 2021-01-01 | Disposition: A | Discharge status: Home / Self Care | Payer: PRIVATE HEALTH INSURANCE | Source: Ambulatory Visit | Attending: Neurosurgery | Admitting: Neurosurgery

## 2021-01-01 DIAGNOSIS — I671 Cerebral aneurysm, nonruptured: Secondary | ICD-10-CM | POA: Insufficient documentation

## 2021-01-01 HISTORY — PX: IR ANGIO INTRA EXTRACRAN SEL INTERNAL CAROTID BILAT MOD SED: IMG5363

## 2021-01-01 HISTORY — PX: IR ANGIO VERTEBRAL SEL VERTEBRAL BILAT MOD SED: IMG5369

## 2021-01-01 LAB — CBC WITH DIFFERENTIAL/PLATELET
Abs Immature Granulocytes: 0.05 10*3/uL (ref 0.00–0.07)
Basophils Absolute: 0 10*3/uL (ref 0.0–0.1)
Basophils Relative: 0 %
Eosinophils Absolute: 0.7 10*3/uL — ABNORMAL HIGH (ref 0.0–0.5)
Eosinophils Relative: 6 %
HCT: 45.7 % (ref 39.0–52.0)
Hemoglobin: 15.6 g/dL (ref 13.0–17.0)
Immature Granulocytes: 0 %
Lymphocytes Relative: 23 %
Lymphs Abs: 2.7 10*3/uL (ref 0.7–4.0)
MCH: 30.8 pg (ref 26.0–34.0)
MCHC: 34.1 g/dL (ref 30.0–36.0)
MCV: 90.1 fL (ref 80.0–100.0)
Monocytes Absolute: 0.8 10*3/uL (ref 0.1–1.0)
Monocytes Relative: 7 %
Neutro Abs: 7.6 10*3/uL (ref 1.7–7.7)
Neutrophils Relative %: 64 %
Platelets: 236 10*3/uL (ref 150–400)
RBC: 5.07 MIL/uL (ref 4.22–5.81)
RDW: 12.3 % (ref 11.5–15.5)
WBC: 11.9 10*3/uL — ABNORMAL HIGH (ref 4.0–10.5)
nRBC: 0 % (ref 0.0–0.2)

## 2021-01-01 LAB — BASIC METABOLIC PANEL
Anion gap: 9 (ref 5–15)
BUN: 6 mg/dL (ref 6–20)
CO2: 23 mmol/L (ref 22–32)
Calcium: 8.7 mg/dL — ABNORMAL LOW (ref 8.9–10.3)
Chloride: 106 mmol/L (ref 98–111)
Creatinine, Ser: 0.89 mg/dL (ref 0.61–1.24)
GFR, Estimated: >60 mL/min (ref >60)
Glucose, Bld: 104 mg/dL — ABNORMAL HIGH (ref 70–99)
Potassium: 3.9 mmol/L (ref 3.5–5.1)
Sodium: 138 mmol/L (ref 135–145)

## 2021-01-01 LAB — URINALYSIS, ROUTINE W REFLEX MICROSCOPIC
Glucose, UA: NEGATIVE mg/dL
Hgb urine dipstick: NEGATIVE
Ketones, ur: NEGATIVE mg/dL
Leukocytes,Ua: NEGATIVE
Nitrite: NEGATIVE
Protein, ur: NEGATIVE mg/dL
Specific Gravity, Urine: 1.025 (ref 1.005–1.030)
pH: 6 (ref 5.0–8.0)

## 2021-01-01 LAB — PROTIME-INR
INR: 1 (ref 0.8–1.2)
Prothrombin Time: 12.8 seconds (ref 11.4–15.2)

## 2021-01-01 LAB — APTT: aPTT: 31 seconds (ref 24–36)

## 2021-01-01 MED ORDER — LIDOCAINE HCL (PF) 1 % IJ SOLN
INTRAMUSCULAR | Status: AC | PRN
  Administered 2021-01-01: 5 mL

## 2021-01-01 MED ORDER — DIAZEPAM 5 MG/ML IJ SOLN: INTRAMUSCULAR | Status: AC | PRN

## 2021-01-01 MED ORDER — SODIUM CHLORIDE 0.9 % IV SOLN: INTRAVENOUS | Status: DC

## 2021-01-01 MED ORDER — HEPARIN SODIUM (PORCINE) 1000 UNIT/ML IJ SOLN
INTRAMUSCULAR | Status: AC | PRN
  Administered 2021-01-01: 2000 [IU] via INTRAVENOUS

## 2021-01-01 MED ORDER — LIDOCAINE HCL 1 % IJ SOLN
INTRAMUSCULAR | Status: AC
  Filled 2021-01-01: qty 20

## 2021-01-01 MED ORDER — CEFAZOLIN SODIUM-DEXTROSE 2-4 GM/100ML-% IV SOLN: 2.0000 g | INTRAVENOUS | Status: DC

## 2021-01-01 MED ORDER — FENTANYL CITRATE (PF) 100 MCG/2ML IJ SOLN
INTRAMUSCULAR | Status: AC
  Filled 2021-01-01: qty 2

## 2021-01-01 MED ORDER — HYDROCODONE-ACETAMINOPHEN 5-325 MG PO TABS: 1.0000 | ORAL_TABLET | ORAL | Status: DC | PRN

## 2021-01-01 MED ORDER — FENTANYL CITRATE (PF) 100 MCG/2ML IJ SOLN
INTRAMUSCULAR | Status: AC | PRN
  Administered 2021-01-01: 25 ug via INTRAVENOUS

## 2021-01-01 MED ORDER — MIDAZOLAM HCL 2 MG/2ML IJ SOLN
INTRAMUSCULAR | Status: AC
  Filled 2021-01-01: qty 2

## 2021-01-01 MED ORDER — CHLORHEXIDINE GLUCONATE CLOTH 2 % EX PADS: 6.0000 | MEDICATED_PAD | Freq: Once | CUTANEOUS | Status: DC

## 2021-01-01 MED ORDER — HEPARIN SODIUM (PORCINE) 1000 UNIT/ML IJ SOLN
INTRAMUSCULAR | Status: AC
  Filled 2021-01-01: qty 10

## 2021-01-01 MED ORDER — IOHEXOL 300 MG/ML  SOLN
100.0000 mL | Freq: Once | INTRAMUSCULAR | Status: AC | PRN
  Administered 2021-01-01: 55 mL via INTRA_ARTERIAL

## 2021-01-01 MED ORDER — MIDAZOLAM HCL 2 MG/2ML IJ SOLN
INTRAMUSCULAR | Status: AC | PRN
  Administered 2021-01-01: 1 mg via INTRAVENOUS

## 2021-01-01 NOTE — Brief Op Note (Signed)
°  NEUROSURGERY BRIEF OPERATIVE  NOTE   PREOP DX: right T-S dAVF  POSTOP DX: Same  PROCEDURE: Diagnostic cerebral angiogram  SURGEON: Dr. Lisbeth Renshaw, MD  ANESTHESIA: IV Sedation with Local  EBL: Minimal  SPECIMENS: None  COMPLICATIONS: None  CONDITION: Stable to recovery  FINDINGS (Full report in CanopyPACS): 1. Progressive recanalization of previously described dural AVF   Lisbeth Renshaw, MD [facility]

## 2021-01-01 NOTE — H&P (Signed)
Chief Complaint  Dural fistula  History of Present Illness  Stephen Terry is a 55 y.o. male with a history of right-sided transverse sigmoid dural AV fistula.  He underwent endovascular embolization approximately 18 months ago.  He had complete resolution of his pulsatile tinnitus.  He did undergo follow-up diagnostic angiogram approximately 1 year ago with recanalization of the fistula but no high-grade features.  We elected to monitor radiographically.  Patient presents today for routine follow-up angiography.  He has not had any recurrence of his tinnitus.  No new headaches.  Past Medical History   Past Medical History:  Diagnosis Date   Anxiety    Bipolar disorder (HCC)    Depression    Hypertension    Stroke [facility])     Past Surgical History   Past Surgical History:  Procedure Laterality Date   IR ANGIO EXTERNAL CAROTID SEL EXT CAROTID BILAT MOD SED  01/03/2020   IR ANGIO EXTERNAL CAROTID SEL EXT CAROTID UNI L MOD SED  05/20/2019   IR ANGIO EXTERNAL CAROTID SEL EXT CAROTID UNI R MOD SED  05/20/2019   IR ANGIO EXTERNAL CAROTID SEL EXT CAROTID UNI R MOD SED  06/21/2019   IR ANGIO INTRA EXTRACRAN SEL INTERNAL CAROTID BILAT MOD SED  05/20/2019   IR ANGIO INTRA EXTRACRAN SEL INTERNAL CAROTID BILAT MOD SED  01/03/2020   IR ANGIO INTRA EXTRACRAN SEL INTERNAL CAROTID UNI R MOD SED  06/21/2019   IR ANGIO VERTEBRAL SEL VERTEBRAL BILAT MOD SED  05/20/2019   IR ANGIO VERTEBRAL SEL VERTEBRAL BILAT MOD SED  01/03/2020   IR ANGIOGRAM FOLLOW UP STUDY  06/21/2019   IR NEURO EACH ADD'L AFTER BASIC UNI RIGHT (MS)  06/21/2019   IR NEURO EACH ADD'L AFTER BASIC UNI RIGHT (MS)  01/03/2020   IR TRANSCATH/EMBOLIZ  06/21/2019   LOOP RECORDER INSERTION     RADIOLOGY WITH ANESTHESIA Right 06/21/2019   Procedure: 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;  Surgeon: Lisbeth Renshaw, MD;  Location: [facility] OR;  Service: Radiology;  Laterality: Right;   WISDOM TOOTH EXTRACTION      Social History   Social History    Tobacco Use   Smoking status: Heavy Smoker    Packs/day: 2.00    Types: Cigarettes   Smokeless tobacco: Never   Tobacco comments:    1.5-2 packs per day  Vaping Use   Vaping Use: Never used  Substance Use Topics   Alcohol use: Yes    Alcohol/week: 54.0 standard drinks    Types: 54 Cans of beer per week    Comment: 6-8 per day, bourbon on weekends   Drug use: Yes    Types: Marijuana    Comment: occassional    Medications   Prior to Admission medications   Medication Sig Start Date End Date Taking? Authorizing Provider  aspirin 81 MG EC tablet Take 1 tablet (81 mg total) by mouth daily. 06/29/19  Yes Costella, Darci Current, PA-C  atorvastatin (LIPITOR) 80 MG tablet Take 1 tablet (80 mg total) by mouth daily at 6 PM. 02/22/19  Yes Biby, Jani Files, NP  fluticasone (FLONASE) 50 MCG/ACT nasal spray Place 1 spray into both nostrils daily as needed for allergies or rhinitis.   Yes [provider]  lamoTRIgine (LAMICTAL) 200 MG tablet Take 200 mg by mouth daily.   Yes [provider]  propranolol (INDERAL) 20 MG tablet Take 20-40 mg by mouth 3 (three) times daily as needed (For tremers).   Yes [provider]  sertraline (ZOLOFT) 100 MG tablet Take 100 mg by mouth daily.    Yes [provider]    Allergies  No Known Allergies  Review of Systems  ROS  Neurologic Exam  Awake, alert, oriented Memory and concentration grossly intact Speech fluent, appropriate CN grossly intact Motor exam: Upper Extremities Deltoid Bicep Tricep Grip  Right 5/5 5/5 5/5 5/5  Left 5/5 5/5 5/5 5/5   Lower Extremities IP Quad PF DF EHL  Right 5/5 5/5 5/5 5/5 5/5  Left 5/5 5/5 5/5 5/5 5/5   Sensation grossly intact to LT  Impression  - 55 y.o. male status post endovascular embolization of right transverse sigmoid dural AV fistula 18 months ago, with 93-month angiogram demonstrating recanalization of the fistula but no high-grade features.  Plan  -We will plan on  proceeding with diagnostic cerebral angiogram  I have reviewed the indications for the procedure as well as the expected postoperative course and recovery.  We have discussed the associated risks, benefits, and alternatives to the angiogram.  All his questions today were answered.  He provided informed consent to proceed.  Lisbeth Renshaw, MD [facility]

## 2021-01-01 NOTE — Sedation Documentation (Signed)
Right femoral sheath removed, 33fr exoseal deployed to right femoral artery.

## 2021-01-15 ENCOUNTER — Other Ambulatory Visit: Payer: Self-pay | Admitting: Neurosurgery

## 2021-01-15 ENCOUNTER — Ambulatory Visit (INDEPENDENT_AMBULATORY_CARE_PROVIDER_SITE_OTHER): Payer: PRIVATE HEALTH INSURANCE

## 2021-01-15 DIAGNOSIS — I639 Cerebral infarction, unspecified: Secondary | ICD-10-CM

## 2021-01-15 DIAGNOSIS — I671 Cerebral aneurysm, nonruptured: Secondary | ICD-10-CM

## 2021-01-15 LAB — CUP PACEART REMOTE DEVICE CHECK
Date Time Interrogation Session: 20221223230303
Implantable Pulse Generator Implant Date: 20210507

## 2021-01-25 NOTE — Progress Notes (Signed)
Carelink Summary Report / Loop Recorder 

## 2021-02-11 ENCOUNTER — Ambulatory Visit
Admission: RE | Admit: 2021-02-11 | Discharge: 2021-02-11 | Disposition: A | Discharge status: Home / Self Care | Payer: PRIVATE HEALTH INSURANCE | Source: Ambulatory Visit | Attending: Neurosurgery | Admitting: Neurosurgery

## 2021-02-11 DIAGNOSIS — I671 Cerebral aneurysm, nonruptured: Secondary | ICD-10-CM

## 2021-02-11 MED ORDER — IOPAMIDOL (ISOVUE-370) INJECTION 76%
75.0000 mL | Freq: Once | INTRAVENOUS | Status: AC | PRN
  Administered 2021-02-11: 75 mL via INTRAVENOUS

## 2021-02-18 ENCOUNTER — Ambulatory Visit (INDEPENDENT_AMBULATORY_CARE_PROVIDER_SITE_OTHER): Payer: Medicaid Other

## 2021-02-18 DIAGNOSIS — I639 Cerebral infarction, unspecified: Secondary | ICD-10-CM | POA: Diagnosis not present

## 2021-02-18 LAB — CUP PACEART REMOTE DEVICE CHECK
Date Time Interrogation Session: 20230129230923
Implantable Pulse Generator Implant Date: 20210507

## 2021-02-26 ENCOUNTER — Other Ambulatory Visit: Payer: Self-pay | Admitting: Neurosurgery

## 2021-02-26 NOTE — Progress Notes (Signed)
Carelink Summary Report / Loop Recorder 

## 2021-02-28 ENCOUNTER — Other Ambulatory Visit: Payer: Self-pay | Admitting: Neurosurgery

## 2021-03-08 NOTE — Progress Notes (Signed)
Surgical Instructions    Your procedure is scheduled on 03/14/21.  Report to [facility] Main Entrance "A" at 10:30 A.M., then check in with the Admitting office.  Call this number if you have problems the morning of surgery:  [phone]   If you have any questions prior to your surgery date call [phone]: Open Monday-Friday 8am-4pm    Remember:  Do not eat or drink after midnight the night before your surgery      Take these medicines the morning of surgery with A SIP OF WATER:  lamoTRIgine (LAMICTAL) sertraline (ZOLOFT)   AS NEEDED: fluticasone (FLONASE)  propranolol (INDERAL)   As of today, STOP taking any Aspirin (unless otherwise instructed by your surgeon) Aleve, Naproxen, Ibuprofen, Motrin, Advil, Goody's, BC's, all herbal medications, fish oil, and all vitamins.           Do not wear jewelry  Do not wear lotions, powders, colognes, or deodorant. Do not shave 48 hours prior to surgery.  Men may shave face and neck. Do not bring valuables to the hospital.   [facility] is not responsible for any belongings or valuables. .   Do NOT Smoke (Tobacco/Vaping)  24 hours prior to your procedure  If you use a CPAP at night, you may bring your mask for your overnight stay.   Contacts, glasses, hearing aids, dentures or partials may not be worn into surgery, please bring cases for these belongings   For patients admitted to the hospital, discharge time will be determined by your treatment team.   Patients discharged the day of surgery will not be allowed to drive home, and someone needs to stay with them for 24 hours.  NO VISITORS WILL BE ALLOWED IN PRE-OP WHERE PATIENTS ARE PREPPED FOR SURGERY.  ONLY 1 SUPPORT PERSON MAY BE PRESENT IN THE WAITING ROOM WHILE YOU ARE IN SURGERY.  IF YOU ARE TO BE ADMITTED, ONCE YOU ARE IN YOUR ROOM YOU WILL BE ALLOWED TWO (2) VISITORS. 1 (ONE) VISITOR MAY STAY OVERNIGHT BUT MUST ARRIVE TO THE ROOM BY 8pm.  Minor children may have two  parents present. Special consideration for safety and communication needs will be reviewed on a case by case basis.  Special instructions:    Oral Hygiene is also important to reduce your risk of infection.  Remember - BRUSH YOUR TEETH THE MORNING OF SURGERY WITH YOUR REGULAR TOOTHPASTE   [place]- Preparing For Surgery  Before surgery, you can play an important role. Because skin is not sterile, your skin needs to be as free of germs as possible. You can reduce the number of germs on your skin by washing with CHG (chlorahexidine gluconate) Soap before surgery.  CHG is an antiseptic cleaner which kills germs and bonds with the skin to continue killing germs even after washing.     Please do not use if you have an allergy to CHG or antibacterial soaps. If your skin becomes reddened/irritated stop using the CHG.  Do not shave (including legs and underarms) for at least 48 hours prior to first CHG shower. It is OK to shave your face.  Please follow these instructions carefully.     Shower the NIGHT BEFORE SURGERY and the MORNING OF SURGERY with CHG Soap.   If you chose to wash your hair, wash your hair first as usual with your normal shampoo. After you shampoo, rinse your hair and body thoroughly to remove the shampoo.  Then [employer] and genitals (private parts) with your normal  soap and rinse thoroughly to remove soap.  After that Use CHG Soap as you would any other liquid soap. You can apply CHG directly to the skin and wash gently with a scrungie or a clean washcloth.   Apply the CHG Soap to your body ONLY FROM THE NECK DOWN.  Do not use on open wounds or open sores. Avoid contact with your eyes, ears, mouth and genitals (private parts). Wash Face and genitals (private parts)  with your normal soap.   Wash thoroughly, paying special attention to the area where your surgery will be performed.  Thoroughly rinse your body with warm water from the neck down.  DO NOT shower/wash with your  normal soap after using and rinsing off the CHG Soap.  Pat yourself dry with a CLEAN TOWEL.  Wear CLEAN PAJAMAS to bed the night before surgery  Place CLEAN SHEETS on your bed the night before your surgery  DO NOT SLEEP WITH PETS.   Day of Surgery:  Take a shower with CHG soap. Wear Clean/Comfortable clothing the morning of surgery Do not apply any deodorants/lotions.   Remember to brush your teeth WITH YOUR REGULAR TOOTHPASTE.    COVID testing  If you are going to stay overnight or be admitted after your procedure/surgery and require a pre-op COVID test, please follow these instructions after your COVID test   You are not required to quarantine however you are required to wear a well-fitting mask when you are out and around people not in your household.  If your mask becomes wet or soiled, replace with a new one.  Wash your hands often with soap and water for 20 seconds or clean your hands with an alcohol-based hand sanitizer that contains at least 60% alcohol.  Do not share personal items.  Notify your provider: if you are in close contact with someone who has COVID  or if you develop a fever of 100.4 or greater, sneezing, cough, sore throat, shortness of breath or body aches.    Please read over the following fact sheets that you were given.

## 2021-03-08 NOTE — Progress Notes (Signed)
Device clinic and [employer] notified of PPM.

## 2021-03-11 ENCOUNTER — Encounter (HOSPITAL_COMMUNITY): Payer: Self-pay | Admitting: Neurosurgery

## 2021-03-11 ENCOUNTER — Other Ambulatory Visit: Payer: Self-pay

## 2021-03-11 ENCOUNTER — Encounter (HOSPITAL_COMMUNITY)
Admission: RE | Admit: 2021-03-11 | Discharge: 2021-03-11 | Disposition: A | Discharge status: Home / Self Care | Payer: PRIVATE HEALTH INSURANCE | Source: Ambulatory Visit | Attending: Neurosurgery | Admitting: Neurosurgery

## 2021-03-11 VITALS — BP 114/72 | HR 71 | Temp 98.2°F | Resp 17 | Ht 74.0 in | Wt 210.0 lb

## 2021-03-11 DIAGNOSIS — Z20822 Contact with and (suspected) exposure to covid-19: Secondary | ICD-10-CM | POA: Diagnosis not present

## 2021-03-11 DIAGNOSIS — F1011 Alcohol abuse, in remission: Secondary | ICD-10-CM | POA: Diagnosis not present

## 2021-03-11 DIAGNOSIS — Z7982 Long term (current) use of aspirin: Secondary | ICD-10-CM | POA: Diagnosis not present

## 2021-03-11 DIAGNOSIS — Z01818 Encounter for other preprocedural examination: Secondary | ICD-10-CM

## 2021-03-11 DIAGNOSIS — Z8673 Personal history of transient ischemic attack (TIA), and cerebral infarction without residual deficits: Secondary | ICD-10-CM | POA: Insufficient documentation

## 2021-03-11 LAB — BASIC METABOLIC PANEL
Anion gap: 11 (ref 5–15)
BUN: 5 mg/dL — ABNORMAL LOW (ref 6–20)
CO2: 25 mmol/L (ref 22–32)
Calcium: 8.5 mg/dL — ABNORMAL LOW (ref 8.9–10.3)
Chloride: 101 mmol/L (ref 98–111)
Creatinine, Ser: 0.98 mg/dL (ref 0.61–1.24)
GFR, Estimated: >60 mL/min (ref >60)
Glucose, Bld: 87 mg/dL (ref 70–99)
Potassium: 4 mmol/L (ref 3.5–5.1)
Sodium: 137 mmol/L (ref 135–145)

## 2021-03-11 LAB — CBC
HCT: 48.6 % (ref 39.0–52.0)
Hemoglobin: 16 g/dL (ref 13.0–17.0)
MCH: 30.2 pg (ref 26.0–34.0)
MCHC: 32.9 g/dL (ref 30.0–36.0)
MCV: 91.9 fL (ref 80.0–100.0)
Platelets: 266 10*3/uL (ref 150–400)
RBC: 5.29 MIL/uL (ref 4.22–5.81)
RDW: 14.5 % (ref 11.5–15.5)
WBC: 12.1 10*3/uL — ABNORMAL HIGH (ref 4.0–10.5)
nRBC: 0 % (ref 0.0–0.2)

## 2021-03-11 LAB — TYPE AND SCREEN
ABO/RH(D): A POS
Antibody Screen: NEGATIVE

## 2021-03-11 NOTE — Progress Notes (Signed)
PCP - Jacolyn Reedy PA Cardiologist - Sharrell Ku MD  PPM/ICD - loop recorder Device Orders -  Rep Notified -   Chest x-ray - no EKG - 03/11/21 Stress Test -  ECHO - 02/21/19 Cardiac Cath - no  Sleep Study - none CPAP -   Fasting Blood Sugar - n/a Checks Blood Sugar _____ times a day  Blood Thinner Instructions:n/a Aspirin Instructions:Pt states his last dose of Aspirin was 03/05/21 per surgeon's instructions.   ERAS Protcol -no PRE-SURGERY Ensure or G2-   COVID TEST- yes 03/11/21   Anesthesia review: yes  Patient denies shortness of breath, fever, cough and chest pain at PAT appointment. Pt states he had allergy symptoms-runny nose and cough - and took a home Covid test yesterday which was negative. Says he has a "smoker's cough" because he smokes several packs of cigarettes a day.   All instructions explained to the patient, with a verbal understanding of the material. Patient agrees to go over the instructions while at home for a better understanding. Patient also instructed to wear a mask out in public after being tested for COVID-19. The opportunity to ask questions was provided.

## 2021-03-12 LAB — SARS CORONAVIRUS 2 (TAT 6-24 HRS): SARS Coronavirus 2: NEGATIVE

## 2021-03-12 NOTE — Progress Notes (Signed)
Anesthesia Chart Review:  Patient was admitted 1/31-02/22/2019 for CVA. Per discharge summary, "Mr. Stephen Terry is a 56 y.o. male with no significant past medical history presenting with L facial droop and slurred speech. Received tPA 02/20/19 at 1613. Tolerated tPA without difficulties. No residual deficits. For d/c home on DAPT x 3 weeks then aspirin alone. Follow up with neurosurgeon for incidental AVM finding."   Hx of etoh abuse, at time of admission for CVA he reported 12 beers/day.  Subsequent notes in Care Everywhere state patient has stopped drinking.   Post discharge pt followed up with cardiology for cryptogenic stroke. He wore an event monitor that was benign. He underwent placement of ILR on 05/27/19.  No A-fib as of last transmission date 02/17/2021.  Patient underwent embolization of right dural AV fistula 0000000 without complication.   Preop labs reviewed, unremarkable.    EKG 03/11/2021: NSR.  Rate 66.  Event monitor 04/21/19: Sinus rhythm No atrial fibrillation Baseline artifact limits interpretation at times Consider EP referral for long term monitoring if additional monitoring is desired   TTE 02/21/19:  1. Left ventricular ejection fraction, by visual estimation, is 60 to  65%. The left ventricle has normal function. There is no left ventricular  hypertrophy.   2. Left ventricular diastolic parameters are consistent with Grade I  diastolic dysfunction (impaired relaxation).   3. The left ventricle has no regional wall motion abnormalities.   4. Global right ventricle has normal systolic function.The right  ventricular size is normal.   5. Left atrial size was normal.   6. Right atrial size was normal.   7. The mitral valve is normal in structure. No evidence of mitral valve  regurgitation. No evidence of mitral stenosis.   8. The tricuspid valve is normal in structure. Tricuspid valve  regurgitation is trivial.   9. The aortic valve is tricuspid. Aortic valve  regurgitation is not  visualized. No evidence of aortic valve sclerosis or stenosis.  10. The pulmonic valve was normal in structure. Pulmonic valve  regurgitation is not visualized.  11. The inferior vena cava is normal in size with greater than 50%  respiratory variability, suggesting right atrial pressure of 3 mmHg.  12. Normal LV systolic function; grade 1 diastolic dysfunction.    Fahad, [facility]/Anesthesiology Phone [phone] 03/12/2021 2:06 PM

## 2021-03-12 NOTE — Anesthesia Preprocedure Evaluation (Addendum)
Anesthesia Evaluation  Patient identified by MRN, date of birth, ID band Patient awake    Reviewed: Allergy & Precautions, NPO status , Patient's Chart, lab work & pertinent test results  Airway Mallampati: II  TM Distance: >3 FB Neck ROM: Full    Dental  (+) Teeth Intact, Dental Advisory Given   Pulmonary Current Smoker and Patient abstained from smoking.,    breath sounds clear to auscultation       Cardiovascular hypertension,  Rhythm:Regular Rate:Normal     Neuro/Psych PSYCHIATRIC DISORDERS Anxiety Depression Bipolar Disorder CVA    GI/Hepatic negative GI ROS, Neg liver ROS,   Endo/Other  negative endocrine ROS  Renal/GU negative Renal ROS     Musculoskeletal negative musculoskeletal ROS (+)   Abdominal Normal abdominal exam  (+)   Peds  Hematology negative hematology ROS (+)   Anesthesia Other Findings   Reproductive/Obstetrics                            Anesthesia Physical Anesthesia Plan  ASA: 3  Anesthesia Plan: General   Post-op Pain Management:    Induction: Intravenous  PONV Risk Score and Plan: 2 and Ondansetron, Dexamethasone and Midazolam  Airway Management Planned: Oral ETT  Additional Equipment: Arterial line  Intra-op Plan:   Post-operative Plan: Extubation in OR  Informed Consent: I have reviewed the patients History and Physical, chart, labs and discussed the procedure including the risks, benefits and alternatives for the proposed anesthesia with the patient or authorized representative who has indicated his/her understanding and acceptance.     Dental advisory given  Plan Discussed with: CRNA  Anesthesia Plan Comments: (PAT note by Karoline Caldwell, PA-C:  Patient was admitted 1/31-02/22/2019 for CVA. Per discharge summary, "StephenStephen Smithis a 56 y.o.malewith no significant past medicalhistory presenting withL facial droop and slurred speech.Received  tPA 02/20/19 at 1613.Tolerated tPA without difficulties. No residual deficits. For d/c home on DAPT x 3 weeks then aspirin alone. Follow up with neurosurgeon for incidental AVM finding."  Hx of etoh abuse, at time of admission for CVA he reported 12 beers/day.  Subsequent notes in Care Everywhere state patient has stopped drinking.  Post discharge pt followed up with cardiology for cryptogenic stroke. He wore an event monitor that was benign. He underwent placement of ILR on 05/27/19.  No A-fib as of last transmission date 02/17/2021.  Patient underwent embolization of right dural AV fistula 0000000 without complication.  Preop labs reviewed, unremarkable.   EKG 03/11/2021: NSR.  Rate 66.  Event monitor 04/21/19: Sinus rhythm No atrial fibrillation Baseline artifact limits interpretation at times Consider EP referral for long term monitoring if additional monitoring is desired  TTE 02/21/19: 1. Left ventricular ejection fraction, by visual estimation, is 60 to  65%. The left ventricle has normal function. There is no left ventricular  hypertrophy.  2. Left ventricular diastolic parameters are consistent with Grade I  diastolic dysfunction (impaired relaxation).  3. The left ventricle has no regional wall motion abnormalities.  4. Global right ventricle has normal systolic function.The right  ventricular size is normal.  5. Left atrial size was normal.  6. Right atrial size was normal.  7. The mitral valve is normal in structure. No evidence of mitral valve  regurgitation. No evidence of mitral stenosis.  8. The tricuspid valve is normal in structure. Tricuspid valve  regurgitation is trivial.  9. The aortic valve is tricuspid. Aortic valve regurgitation is not  visualized. No  evidence of aortic valve sclerosis or stenosis.  10. The pulmonic valve was normal in structure. Pulmonic valve  regurgitation is not visualized.  11. The inferior vena cava is normal in size with  greater than 50%  respiratory variability, suggesting right atrial pressure of 3 mmHg.  12. Normal LV systolic function; grade 1 diastolic dysfunction.   )       Anesthesia Quick Evaluation

## 2021-03-14 ENCOUNTER — Inpatient Hospital Stay (HOSPITAL_COMMUNITY)
Admission: RE | Admit: 2021-03-14 | Discharge: 2021-03-15 | DRG: 027 | Disposition: A | Discharge status: Home / Self Care | Payer: PRIVATE HEALTH INSURANCE | Attending: Neurosurgery | Admitting: Neurosurgery

## 2021-03-14 ENCOUNTER — Other Ambulatory Visit: Payer: Self-pay

## 2021-03-14 ENCOUNTER — Encounter (HOSPITAL_COMMUNITY): Payer: Self-pay | Admitting: Neurosurgery

## 2021-03-14 ENCOUNTER — Encounter (HOSPITAL_COMMUNITY)
Admission: RE | Disposition: A | Discharge status: Home / Self Care | Payer: Self-pay | Source: Home / Self Care | Attending: Neurosurgery

## 2021-03-14 ENCOUNTER — Inpatient Hospital Stay (HOSPITAL_COMMUNITY): Payer: PRIVATE HEALTH INSURANCE | Admitting: Physician Assistant

## 2021-03-14 ENCOUNTER — Inpatient Hospital Stay (HOSPITAL_COMMUNITY): Payer: PRIVATE HEALTH INSURANCE | Admitting: Anesthesiology

## 2021-03-14 DIAGNOSIS — Z8673 Personal history of transient ischemic attack (TIA), and cerebral infarction without residual deficits: Secondary | ICD-10-CM

## 2021-03-14 DIAGNOSIS — I671 Cerebral aneurysm, nonruptured: Secondary | ICD-10-CM

## 2021-03-14 DIAGNOSIS — F319 Bipolar disorder, unspecified: Secondary | ICD-10-CM | POA: Diagnosis present

## 2021-03-14 DIAGNOSIS — G9612 Meningeal adhesions (cerebral) (spinal): Secondary | ICD-10-CM | POA: Diagnosis present

## 2021-03-14 DIAGNOSIS — Z01818 Encounter for other preprocedural examination: Secondary | ICD-10-CM

## 2021-03-14 DIAGNOSIS — Z79899 Other long term (current) drug therapy: Secondary | ICD-10-CM | POA: Diagnosis not present

## 2021-03-14 DIAGNOSIS — Z7982 Long term (current) use of aspirin: Secondary | ICD-10-CM

## 2021-03-14 DIAGNOSIS — F17210 Nicotine dependence, cigarettes, uncomplicated: Secondary | ICD-10-CM | POA: Diagnosis present

## 2021-03-14 DIAGNOSIS — I10 Essential (primary) hypertension: Secondary | ICD-10-CM | POA: Diagnosis present

## 2021-03-14 HISTORY — PX: CRANIOTOMY: SHX93

## 2021-03-14 HISTORY — PX: APPLICATION OF CRANIAL NAVIGATION: SHX6578

## 2021-03-14 LAB — MRSA NEXT GEN BY PCR, NASAL: MRSA by PCR Next Gen: NOT DETECTED

## 2021-03-14 LAB — ABO/RH: ABO/RH(D): A POS

## 2021-03-14 SURGERY — CRANIOTOMY INTRACRANIAL ARTERIO-VENOUS MALFORMATION DURAL COMPLEX (AVM)
Anesthesia: General | Site: Head | Laterality: Right

## 2021-03-14 MED ORDER — GELATIN ABSORBABLE MT POWD
OROMUCOSAL | Status: DC | PRN
  Administered 2021-03-14: 5 mL via TOPICAL

## 2021-03-14 MED ORDER — ROCURONIUM BROMIDE 10 MG/ML (PF) SYRINGE
PREFILLED_SYRINGE | INTRAVENOUS | Status: DC | PRN
  Administered 2021-03-14: 70 mg via INTRAVENOUS
  Administered 2021-03-14 (×2): 30 mg via INTRAVENOUS
  Administered 2021-03-14: 20 mg via INTRAVENOUS

## 2021-03-14 MED ORDER — BUPIVACAINE HCL (PF) 0.5 % IJ SOLN
INTRAMUSCULAR | Status: AC
  Filled 2021-03-14: qty 30

## 2021-03-14 MED ORDER — CHLORHEXIDINE GLUCONATE 0.12 % MT SOLN: 15.0000 mL | Freq: Once | OROMUCOSAL | Status: AC

## 2021-03-14 MED ORDER — LIDOCAINE HCL (PF) 1 % IJ SOLN
INTRAMUSCULAR | Status: AC
  Filled 2021-03-14: qty 30

## 2021-03-14 MED ORDER — LIDOCAINE HCL (PF) 1 % IJ SOLN
INTRAMUSCULAR | Status: DC | PRN
Start: 2021-03-14 — End: 2021-03-14
  Administered 2021-03-14: 3.5 mL

## 2021-03-14 MED ORDER — SODIUM CHLORIDE 0.9 % IV SOLN
0.1500 ug/kg/min | INTRAVENOUS | Status: DC
  Administered 2021-03-14 (×2): .05 ug/kg/min via INTRAVENOUS
  Filled 2021-03-14: qty 2000

## 2021-03-14 MED ORDER — ONDANSETRON HCL 4 MG/2ML IJ SOLN: 4.0000 mg | INTRAMUSCULAR | Status: DC | PRN

## 2021-03-14 MED ORDER — MIDAZOLAM HCL 2 MG/2ML IJ SOLN
INTRAMUSCULAR | Status: DC | PRN
Start: 2021-03-14 — End: 2021-03-14
  Administered 2021-03-14: 2 mg via INTRAVENOUS

## 2021-03-14 MED ORDER — PROPRANOLOL HCL 20 MG PO TABS: 20.0000 mg | ORAL_TABLET | Freq: Three times a day (TID) | ORAL | Status: DC | PRN

## 2021-03-14 MED ORDER — BUPIVACAINE HCL 0.5 % IJ SOLN
INTRAMUSCULAR | Status: DC | PRN
  Administered 2021-03-14: 3.5 mL

## 2021-03-14 MED ORDER — LIDOCAINE 2% (20 MG/ML) 5 ML SYRINGE
INTRAMUSCULAR | Status: DC | PRN
  Administered 2021-03-14: 100 mg via INTRAVENOUS

## 2021-03-14 MED ORDER — HYDRALAZINE HCL 20 MG/ML IJ SOLN: 5.0000 mg | Freq: Once | INTRAMUSCULAR | Status: DC

## 2021-03-14 MED ORDER — PHENYLEPHRINE HCL-NACL 20-0.9 MG/250ML-% IV SOLN
INTRAVENOUS | Status: DC | PRN
Start: 2021-03-14 — End: 2021-03-14
  Administered 2021-03-14 (×2): 20 ug/min via INTRAVENOUS

## 2021-03-14 MED ORDER — LIDOCAINE 2% (20 MG/ML) 5 ML SYRINGE
INTRAMUSCULAR | Status: AC
  Filled 2021-03-14: qty 5

## 2021-03-14 MED ORDER — FENTANYL CITRATE (PF) 100 MCG/2ML IJ SOLN
25.0000 ug | INTRAMUSCULAR | Status: DC | PRN
  Administered 2021-03-14 (×3): 50 ug via INTRAVENOUS

## 2021-03-14 MED ORDER — DEXAMETHASONE SODIUM PHOSPHATE 10 MG/ML IJ SOLN
INTRAMUSCULAR | Status: AC
  Filled 2021-03-14: qty 1

## 2021-03-14 MED ORDER — ACETAMINOPHEN 10 MG/ML IV SOLN: 1000.0000 mg | Freq: Once | INTRAVENOUS | Status: DC | PRN

## 2021-03-14 MED ORDER — FENTANYL CITRATE (PF) 100 MCG/2ML IJ SOLN
INTRAMUSCULAR | Status: AC
  Filled 2021-03-14: qty 2

## 2021-03-14 MED ORDER — SODIUM CHLORIDE 0.9 % IV SOLN: INTRAVENOUS | Status: DC

## 2021-03-14 MED ORDER — CHLORHEXIDINE GLUCONATE CLOTH 2 % EX PADS
6.0000 | MEDICATED_PAD | Freq: Every day | CUTANEOUS | Status: DC
  Administered 2021-03-14 – 2021-03-15 (×2): 6 via TOPICAL

## 2021-03-14 MED ORDER — ONDANSETRON HCL 4 MG/2ML IJ SOLN
INTRAMUSCULAR | Status: DC | PRN
  Administered 2021-03-14: 4 mg via INTRAVENOUS

## 2021-03-14 MED ORDER — SUGAMMADEX SODIUM 200 MG/2ML IV SOLN
INTRAVENOUS | Status: DC | PRN
  Administered 2021-03-14: 400 mg via INTRAVENOUS

## 2021-03-14 MED ORDER — 0.9 % SODIUM CHLORIDE (POUR BTL) OPTIME
TOPICAL | Status: DC | PRN
Start: 2021-03-14 — End: 2021-03-14
  Administered 2021-03-14: 3000 mL

## 2021-03-14 MED ORDER — HYDROCODONE-ACETAMINOPHEN 5-325 MG PO TABS
1.0000 | ORAL_TABLET | ORAL | Status: DC | PRN
  Administered 2021-03-14 – 2021-03-15 (×5): 1 via ORAL
  Filled 2021-03-14 (×5): qty 1

## 2021-03-14 MED ORDER — CEFAZOLIN SODIUM-DEXTROSE 2-4 GM/100ML-% IV SOLN
2.0000 g | Freq: Three times a day (TID) | INTRAVENOUS | Status: AC
  Administered 2021-03-14 – 2021-03-15 (×2): 2 g via INTRAVENOUS
  Filled 2021-03-14 (×2): qty 100

## 2021-03-14 MED ORDER — PROPOFOL 10 MG/ML IV BOLUS
INTRAVENOUS | Status: AC
  Filled 2021-03-14: qty 20

## 2021-03-14 MED ORDER — CHLORHEXIDINE GLUCONATE CLOTH 2 % EX PADS: 6.0000 | MEDICATED_PAD | Freq: Once | CUTANEOUS | Status: DC

## 2021-03-14 MED ORDER — FLUTICASONE PROPIONATE 50 MCG/ACT NA SUSP: 1.0000 | Freq: Every day | NASAL | Status: DC | PRN

## 2021-03-14 MED ORDER — HYDRALAZINE HCL 20 MG/ML IJ SOLN
INTRAMUSCULAR | Status: AC
  Administered 2021-03-14 (×2): 5 mg
  Filled 2021-03-14: qty 1

## 2021-03-14 MED ORDER — LACTATED RINGERS IV SOLN: INTRAVENOUS | Status: DC

## 2021-03-14 MED ORDER — MIDAZOLAM HCL 2 MG/2ML IJ SOLN
INTRAMUSCULAR | Status: AC
  Filled 2021-03-14: qty 2

## 2021-03-14 MED ORDER — THROMBIN 20000 UNITS EX SOLR
CUTANEOUS | Status: AC
  Filled 2021-03-14: qty 20000

## 2021-03-14 MED ORDER — ONDANSETRON HCL 4 MG PO TABS: 4.0000 mg | ORAL_TABLET | ORAL | Status: DC | PRN

## 2021-03-14 MED ORDER — THROMBIN 5000 UNITS EX SOLR
CUTANEOUS | Status: AC
  Filled 2021-03-14: qty 5000

## 2021-03-14 MED ORDER — DEXAMETHASONE SODIUM PHOSPHATE 10 MG/ML IJ SOLN
INTRAMUSCULAR | Status: DC | PRN
  Administered 2021-03-14: 10 mg via INTRAVENOUS

## 2021-03-14 MED ORDER — BACITRACIN ZINC 500 UNIT/GM EX OINT
TOPICAL_OINTMENT | CUTANEOUS | Status: DC | PRN
  Administered 2021-03-14: 1 via TOPICAL

## 2021-03-14 MED ORDER — NITROGLYCERIN IN D5W 200-5 MCG/ML-% IV SOLN
INTRAVENOUS | Status: AC
  Filled 2021-03-14: qty 250

## 2021-03-14 MED ORDER — AMISULPRIDE (ANTIEMETIC) 5 MG/2ML IV SOLN: 10.0000 mg | Freq: Once | INTRAVENOUS | Status: DC | PRN

## 2021-03-14 MED ORDER — ACETAMINOPHEN 325 MG PO TABS: 325.0000 mg | ORAL_TABLET | ORAL | Status: DC | PRN

## 2021-03-14 MED ORDER — ROCURONIUM BROMIDE 10 MG/ML (PF) SYRINGE
PREFILLED_SYRINGE | INTRAVENOUS | Status: AC
  Filled 2021-03-14: qty 20

## 2021-03-14 MED ORDER — PHENYLEPHRINE 40 MCG/ML (10ML) SYRINGE FOR IV PUSH (FOR BLOOD PRESSURE SUPPORT)
PREFILLED_SYRINGE | INTRAVENOUS | Status: DC | PRN
  Administered 2021-03-14: 80 ug via INTRAVENOUS
  Administered 2021-03-14: 120 ug via INTRAVENOUS
  Administered 2021-03-14: 80 ug via INTRAVENOUS

## 2021-03-14 MED ORDER — PROPOFOL 500 MG/50ML IV EMUL
INTRAVENOUS | Status: DC | PRN
  Administered 2021-03-14: 75 ug/kg/min via INTRAVENOUS

## 2021-03-14 MED ORDER — PROPOFOL 1000 MG/100ML IV EMUL
INTRAVENOUS | Status: AC
  Filled 2021-03-14: qty 100

## 2021-03-14 MED ORDER — LAMOTRIGINE 100 MG PO TABS
200.0000 mg | ORAL_TABLET | Freq: Every day | ORAL | Status: DC
  Administered 2021-03-15: 200 mg via ORAL
  Filled 2021-03-14: qty 2

## 2021-03-14 MED ORDER — ACETAMINOPHEN 650 MG RE SUPP: 650.0000 mg | RECTAL | Status: DC | PRN

## 2021-03-14 MED ORDER — SERTRALINE HCL 50 MG PO TABS
100.0000 mg | ORAL_TABLET | Freq: Every day | ORAL | Status: DC
  Administered 2021-03-15: 100 mg via ORAL
  Filled 2021-03-14: qty 2

## 2021-03-14 MED ORDER — BISACODYL 10 MG RE SUPP: 10.0000 mg | Freq: Every day | RECTAL | Status: DC | PRN

## 2021-03-14 MED ORDER — SODIUM CHLORIDE 0.9 % IV SOLN: INTRAVENOUS | Status: DC | PRN

## 2021-03-14 MED ORDER — FENTANYL CITRATE (PF) 250 MCG/5ML IJ SOLN
INTRAMUSCULAR | Status: DC | PRN
  Administered 2021-03-14 (×2): 50 ug via INTRAVENOUS
  Administered 2021-03-14: 100 ug via INTRAVENOUS
  Administered 2021-03-14: 50 ug via INTRAVENOUS

## 2021-03-14 MED ORDER — ATORVASTATIN CALCIUM 80 MG PO TABS
80.0000 mg | ORAL_TABLET | Freq: Every day | ORAL | Status: DC
  Administered 2021-03-14: 80 mg via ORAL
  Filled 2021-03-14: qty 1

## 2021-03-14 MED ORDER — PROPOFOL 10 MG/ML IV BOLUS
INTRAVENOUS | Status: DC | PRN
  Administered 2021-03-14: 50 mg via INTRAVENOUS
  Administered 2021-03-14: 150 mg via INTRAVENOUS

## 2021-03-14 MED ORDER — ONDANSETRON HCL 4 MG/2ML IJ SOLN
INTRAMUSCULAR | Status: AC
  Filled 2021-03-14: qty 2

## 2021-03-14 MED ORDER — LABETALOL HCL 5 MG/ML IV SOLN
10.0000 mg | INTRAVENOUS | Status: DC | PRN
  Administered 2021-03-14: 20 mg via INTRAVENOUS
  Filled 2021-03-14: qty 8

## 2021-03-14 MED ORDER — ORAL CARE MOUTH RINSE: 15.0000 mL | Freq: Once | OROMUCOSAL | Status: AC

## 2021-03-14 MED ORDER — CEFAZOLIN SODIUM-DEXTROSE 2-4 GM/100ML-% IV SOLN
2.0000 g | INTRAVENOUS | Status: AC
  Administered 2021-03-14: 2 g via INTRAVENOUS

## 2021-03-14 MED ORDER — PROMETHAZINE HCL 25 MG PO TABS: 12.5000 mg | ORAL_TABLET | ORAL | Status: DC | PRN

## 2021-03-14 MED ORDER — CHLORHEXIDINE GLUCONATE 0.12 % MT SOLN
OROMUCOSAL | Status: AC
Start: 2021-03-14 — End: 2021-03-14
  Administered 2021-03-14: 15 mL via OROMUCOSAL
  Filled 2021-03-14: qty 15

## 2021-03-14 MED ORDER — SENNOSIDES-DOCUSATE SODIUM 8.6-50 MG PO TABS: 1.0000 | ORAL_TABLET | Freq: Every evening | ORAL | Status: DC | PRN

## 2021-03-14 MED ORDER — PANTOPRAZOLE SODIUM 40 MG IV SOLR
40.0000 mg | Freq: Every day | INTRAVENOUS | Status: DC
  Administered 2021-03-14: 40 mg via INTRAVENOUS
  Filled 2021-03-14: qty 10

## 2021-03-14 MED ORDER — ACETAMINOPHEN 160 MG/5ML PO SOLN: 325.0000 mg | ORAL | Status: DC | PRN

## 2021-03-14 MED ORDER — BACITRACIN ZINC 500 UNIT/GM EX OINT
TOPICAL_OINTMENT | CUTANEOUS | Status: AC
  Filled 2021-03-14: qty 28.35

## 2021-03-14 MED ORDER — THROMBIN 20000 UNITS EX SOLR
CUTANEOUS | Status: DC | PRN
  Administered 2021-03-14: 20 mL via TOPICAL

## 2021-03-14 MED ORDER — MORPHINE SULFATE (PF) 2 MG/ML IV SOLN
1.0000 mg | INTRAVENOUS | Status: DC | PRN
  Administered 2021-03-14 – 2021-03-15 (×4): 2 mg via INTRAVENOUS
  Filled 2021-03-14 (×4): qty 1

## 2021-03-14 MED ORDER — LEVETIRACETAM IN NACL 500 MG/100ML IV SOLN
500.0000 mg | Freq: Two times a day (BID) | INTRAVENOUS | Status: DC
  Administered 2021-03-14 – 2021-03-15 (×2): 500 mg via INTRAVENOUS
  Filled 2021-03-14 (×2): qty 100

## 2021-03-14 MED ORDER — PHENYLEPHRINE 40 MCG/ML (10ML) SYRINGE FOR IV PUSH (FOR BLOOD PRESSURE SUPPORT)
PREFILLED_SYRINGE | INTRAVENOUS | Status: AC
  Filled 2021-03-14: qty 10

## 2021-03-14 MED ORDER — CEFAZOLIN SODIUM-DEXTROSE 2-4 GM/100ML-% IV SOLN
INTRAVENOUS | Status: AC
Start: 2021-03-14 — End: 2021-03-14
  Filled 2021-03-14: qty 100

## 2021-03-14 MED ORDER — ARTIFICIAL TEARS OPHTHALMIC OINT
TOPICAL_OINTMENT | OPHTHALMIC | Status: AC
  Filled 2021-03-14: qty 3.5

## 2021-03-14 MED ORDER — ACETAMINOPHEN 325 MG PO TABS: 650.0000 mg | ORAL_TABLET | ORAL | Status: DC | PRN

## 2021-03-14 MED ORDER — FENTANYL CITRATE (PF) 250 MCG/5ML IJ SOLN
INTRAMUSCULAR | Status: AC
  Filled 2021-03-14: qty 5

## 2021-03-14 SURGICAL SUPPLY — 90 items
BAG COUNTER SPONGE SURGICOUNT (BAG) ×2 IMPLANT
BAND RUBBER #18 3X1/16 STRL (MISCELLANEOUS) ×4 IMPLANT
BENZOIN TINCTURE PRP APPL 2/3 (GAUZE/BANDAGES/DRESSINGS) IMPLANT
BIT DRILL WIRE PASS 1.3MM (BIT) IMPLANT
BLADE SAW GIGLI 16 STRL (MISCELLANEOUS) IMPLANT
BLADE SURG 15 STRL LF DISP TIS (BLADE) ×1 IMPLANT
BLADE SURG 15 STRL SS (BLADE)
BLADE ULTRA TIP 2M (BLADE) IMPLANT
BNDG GAUZE ELAST 4 BULKY (GAUZE/BANDAGES/DRESSINGS) IMPLANT
BNDG STRETCH 4X75 STRL LF (GAUZE/BANDAGES/DRESSINGS) IMPLANT
BUR ACORN 6.0 PRECISION (BURR) ×2 IMPLANT
BUR MATCHSTICK NEURO 3.0 LAGG (BURR) IMPLANT
BUR ROUND FLUTED 4 SOFT TCH (BURR) IMPLANT
BUR SPIRAL ROUTER 2.3 (BUR) ×1 IMPLANT
CABLE BIPOLOR RESECTION CORD (MISCELLANEOUS) ×1 IMPLANT
CANISTER SUCT 3000ML PPV (MISCELLANEOUS) ×3 IMPLANT
CARTRIDGE OIL MAESTRO DRILL (MISCELLANEOUS) ×1 IMPLANT
CLIP VESOCCLUDE MED 6/CT (CLIP) IMPLANT
DECANTER SPIKE VIAL GLASS SM (MISCELLANEOUS) ×2 IMPLANT
DERMABOND ADVANCED (GAUZE/BANDAGES/DRESSINGS) ×1
DERMABOND ADVANCED .7 DNX12 (GAUZE/BANDAGES/DRESSINGS) IMPLANT
DIFFUSER DRILL AIR PNEUMATIC (MISCELLANEOUS) ×2 IMPLANT
DRAPE MICROSCOPE LEICA (MISCELLANEOUS) ×2 IMPLANT
DRAPE NEUROLOGICAL W/INCISE (DRAPES) ×2 IMPLANT
DRAPE WARM FLUID 44X44 (DRAPES) ×2 IMPLANT
DRILL WIRE PASS 1.3MM (BIT)
DRSG ADAPTIC 3X8 NADH LF (GAUZE/BANDAGES/DRESSINGS) ×1 IMPLANT
DRSG OPSITE POSTOP 4X6 (GAUZE/BANDAGES/DRESSINGS) ×1 IMPLANT
DRSG TELFA 3X8 NADH (GAUZE/BANDAGES/DRESSINGS) ×2 IMPLANT
DURAPREP 6ML APPLICATOR 50/CS (WOUND CARE) ×2 IMPLANT
ELECT REM PT RETURN 9FT ADLT (ELECTROSURGICAL)
ELECTRODE REM PT RTRN 9FT ADLT (ELECTROSURGICAL) ×1 IMPLANT
EVACUATOR SILICONE 100CC (DRAIN) IMPLANT
FELT TEFLON 1X6 (MISCELLANEOUS) ×1 IMPLANT
GAUZE 4X4 16PLY ~~LOC~~+RFID DBL (SPONGE) ×2 IMPLANT
GAUZE SPONGE 4X4 12PLY STRL (GAUZE/BANDAGES/DRESSINGS) IMPLANT
GLOVE EXAM NITRILE XL STR (GLOVE) IMPLANT
GLOVE SURG ENC MOIS LTX SZ7.5 (GLOVE) IMPLANT
GLOVE SURG LTX SZ7 (GLOVE) ×4 IMPLANT
GLOVE SURG UNDER POLY LF SZ7 (GLOVE) ×2 IMPLANT
GLOVE SURG UNDER POLY LF SZ7.5 (GLOVE) ×4 IMPLANT
GOWN STRL REUS W/ TWL LRG LVL3 (GOWN DISPOSABLE) ×2 IMPLANT
GOWN STRL REUS W/ TWL XL LVL3 (GOWN DISPOSABLE) IMPLANT
GOWN STRL REUS W/TWL 2XL LVL3 (GOWN DISPOSABLE) IMPLANT
GOWN STRL REUS W/TWL LRG LVL3 (GOWN DISPOSABLE) ×3
GOWN STRL REUS W/TWL XL LVL3 (GOWN DISPOSABLE) ×2
GRAFT DURAGEN MATRIX 2WX2L ×1 IMPLANT
HEMOSTAT POWDER KIT SURGIFOAM (HEMOSTASIS) ×2 IMPLANT
HEMOSTAT SURGICEL 2X14 (HEMOSTASIS) ×1 IMPLANT
HOOK DURA (MISCELLANEOUS) ×2 IMPLANT
KIT BASIN OR (CUSTOM PROCEDURE TRAY) ×2 IMPLANT
KIT DRAIN CSF ACCUDRAIN (MISCELLANEOUS) IMPLANT
KIT TURNOVER KIT B (KITS) ×2 IMPLANT
KNIFE ARACHNOID DISP AM-24-S (MISCELLANEOUS) ×1 IMPLANT
MARKER SPHERE PSV REFLC 13MM (MARKER) ×4 IMPLANT
NEEDLE HYPO 22GX1.5 SAFETY (NEEDLE) ×2 IMPLANT
NS IRRIG 1000ML POUR BTL (IV SOLUTION) ×4 IMPLANT
OIL CARTRIDGE MAESTRO DRILL (MISCELLANEOUS) ×2
PACK BATTERY CMF DISP FOR DVR (ORTHOPEDIC DISPOSABLE SUPPLIES) ×1 IMPLANT
PACK CRANIOTOMY CUSTOM (CUSTOM PROCEDURE TRAY) ×2 IMPLANT
PAD ARMBOARD 7.5X6 YLW CONV (MISCELLANEOUS) ×4 IMPLANT
PAD DRESSING TELFA 3X8 NADH (GAUZE/BANDAGES/DRESSINGS) ×1 IMPLANT
PATTIES SURGICAL .25X.25 (GAUZE/BANDAGES/DRESSINGS) IMPLANT
PATTIES SURGICAL .5 X.5 (GAUZE/BANDAGES/DRESSINGS) IMPLANT
PATTIES SURGICAL .5 X3 (DISPOSABLE) IMPLANT
PATTIES SURGICAL 1/4 X 3 (GAUZE/BANDAGES/DRESSINGS) IMPLANT
PATTIES SURGICAL 1X1 (DISPOSABLE) IMPLANT
PIN MAYFIELD SKULL DISP (PIN) ×1 IMPLANT
PLATE CRANIAL 4H UNI NEURO III (Plate) ×3 IMPLANT
SCREW UNIII AXS SD 1.5X4 (Screw) ×6 IMPLANT
SPONGE NEURO XRAY DETECT 1X3 (DISPOSABLE) IMPLANT
SPONGE SURGIFOAM ABS GEL 100 (HEMOSTASIS) ×2 IMPLANT
SPONGE SURGIFOAM ABS GEL 100C (HEMOSTASIS) IMPLANT
STAPLER VISISTAT 35W (STAPLE) ×2 IMPLANT
STOCKINETTE 6  STRL (DRAPES) ×1
STOCKINETTE 6 STRL (DRAPES) ×1 IMPLANT
SUT ETHILON 3 0 FSL (SUTURE) IMPLANT
SUT NURALON 4 0 TR CR/8 (SUTURE) ×4 IMPLANT
SUT VIC AB 0 CT1 18XCR BRD8 (SUTURE) ×2 IMPLANT
SUT VIC AB 0 CT1 8-18 (SUTURE) ×1
SUT VIC AB 3-0 SH 8-18 (SUTURE) ×3 IMPLANT
SUT VICRYL 3-0 RB1 18 ABS (SUTURE) ×1 IMPLANT
TAPE CLOTH 1X10 TAN NS (GAUZE/BANDAGES/DRESSINGS) ×1 IMPLANT
TIP NONSTICK .5MMX23CM (INSTRUMENTS) ×2
TIP NONSTICK .5X23 (INSTRUMENTS) IMPLANT
TOWEL GREEN STERILE (TOWEL DISPOSABLE) ×2 IMPLANT
TOWEL GREEN STERILE FF (TOWEL DISPOSABLE) ×2 IMPLANT
TRAY FOLEY MTR SLVR 16FR STAT (SET/KITS/TRAYS/PACK) ×1 IMPLANT
UNDERPAD 30X36 HEAVY ABSORB (UNDERPADS AND DIAPERS) IMPLANT
WATER STERILE IRR 1000ML POUR (IV SOLUTION) ×2 IMPLANT

## 2021-03-14 NOTE — Anesthesia Procedure Notes (Signed)
Procedure Name: Intubation Date/Time: 03/14/2021 1:28 PM Performed by: Kara Mead, CRNA Pre-anesthesia Checklist: Patient identified, Emergency Drugs available, Suction available and Patient being monitored Patient Re-evaluated:Patient Re-evaluated prior to induction Oxygen Delivery Method: Circle system utilized Preoxygenation: Pre-oxygenation with 100% oxygen Induction Type: IV induction Ventilation: Two handed mask ventilation required and Oral airway inserted - appropriate to patient size Grade View: Grade I Tube type: Oral Tube size: 8.0 mm Number of attempts: 1 Airway Equipment and Method: Stylet and Oral airway Placement Confirmation: ETT inserted through vocal cords under direct vision, positive ETCO2 and breath sounds checked- equal and bilateral Secured at: 25 cm Tube secured with: Tape Dental Injury: Teeth and Oropharynx as per pre-operative assessment

## 2021-03-14 NOTE — Transfer of Care (Signed)
Immediate Anesthesia Transfer of Care Note  Patient: Stephen Terry  Procedure(s) Performed: STEREOTACTIC RIGHT CRANIOTOMY FOR LIGATION OF DURAL FISTULA (Right: Head) APPLICATION OF CRANIAL NAVIGATION (Right: Head)  Patient Location: PACU  Anesthesia Type:General  Level of Consciousness: awake, alert  and oriented  Airway & Oxygen Therapy: Patient Spontanous Breathing and Patient connected to face mask oxygen  Post-op Assessment: Report given to RN and Post -op Vital signs reviewed and stable  Post vital signs: Reviewed and stable  Last Vitals:  Vitals Value Taken Time  BP 153/82 03/14/21 1633  Temp 36.6 C 03/14/21 1635  Pulse 65 03/14/21 1644  Resp 18 03/14/21 1644  SpO2 92 % 03/14/21 1644  Vitals shown include unvalidated device data.  Last Pain:  Vitals:   03/14/21 1054  TempSrc:   PainSc: 0-No pain         Complications: No notable events documented.

## 2021-03-14 NOTE — Anesthesia Postprocedure Evaluation (Signed)
Anesthesia Post Note  Patient: Stephen Terry  Procedure(s) Performed: STEREOTACTIC RIGHT CRANIOTOMY FOR LIGATION OF DURAL FISTULA (Right: Head) APPLICATION OF CRANIAL NAVIGATION (Right: Head)     Patient location during evaluation: PACU Anesthesia Type: General Level of consciousness: awake and alert Pain management: pain level controlled Vital Signs Assessment: post-procedure vital signs reviewed and stable Respiratory status: spontaneous breathing, nonlabored ventilation, respiratory function stable and patient connected to nasal cannula oxygen Cardiovascular status: blood pressure returned to baseline and stable Postop Assessment: no apparent nausea or vomiting Anesthetic complications: no   No notable events documented.  Last Vitals:  Vitals:   03/14/21 1735 03/14/21 1750  BP: (!) 139/95 (!) 155/84  Pulse: 69 65  Resp: 17 16  Temp:    SpO2: 95% 96%              Shelton Silvas

## 2021-03-14 NOTE — Anesthesia Procedure Notes (Signed)
Arterial Line Insertion Start/End2/23/2023 12:00 PM, 03/14/2021 12:15 PM Performed by: Shelton Silvas, MD, Kara Mead, CRNA, CRNA  Patient location: Pre-op. Preanesthetic checklist: patient identified, IV checked, site marked, risks and benefits discussed, surgical consent, monitors and equipment checked, pre-op evaluation, timeout performed and anesthesia consent Lidocaine 1% used for infiltration Right, radial was placed Catheter size: 20 G Hand hygiene performed  and maximum sterile barriers used   Attempts: 2 Procedure performed without using ultrasound guided technique. Following insertion, dressing applied and Biopatch. Patient tolerated the procedure well with no immediate complications.

## 2021-03-14 NOTE — Op Note (Signed)
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 Lisbeth Renshaw, MD  ASSISTANT: Dr. Monia Pouch, MD  ANESTHESIA: General Endotracheal  EBL: 100cc  SPECIMENS: None  DRAINS: None  COMPLICATIONS: None immediate  CONDITION: Hemodynamically stable to postanesthesia care unit  HISTORY: Stephen Terry is a 56 y.o. male initially presenting several years ago with pulsatile tinnitus on the right side.  He was found to harbor a dural AV fistula and underwent an vascular embolization.  He had complete resolution of his pulsatile tinnitus.  He underwent follow-up diagnostic angiogram revealing recanalization of his fistula with another angiogram more recently revealing the fistula to be high-grade and posing a high risk of intracranial hemorrhage.  Treatment options were therefore discussed at length in the office and he elected to proceed with surgical ligation.  The risks, benefits, and alternatives to surgery were all reviewed in detail with the patient.  After all his questions were answered informed consent was obtained and witnessed.  PROCEDURE IN DETAIL: The patient was brought to the operating room. After induction of general anesthesia, the patient was positioned on the operative table in the [place] head holder in the supine position with a large right-sided shoulder roll.  Patient was then positioned with the mastoid up, and the vertex was tilted downward. All pressure points were meticulously padded.  Utilizing the preoperative CT angiogram, surface markers were coregistered until a satisfactory accuracy was achieved with the stereotactic system.  This was then used to plan out a standard retromastoid curvilinear skin incision.  The underlying  transverse and sigmoid sinuses were delineated.  Skin incision was then marked out and prepped and draped in the usual sterile fashion.  After timeout was conducted, the incision was infiltrated with local anesthetic with epinephrine.  Incision was then made sharply and carried down through the subcutaneous tissue.  Bovie electrocautery was then used to incise the temporalis fascia and the periosteum was incised.  The sternocleidomastoid and digastric muscles were elevated from the mastoid.  Self-retaining retractors were then placed.  The stereotactic system was again used to identify the underlying transverse and sigmoid sinuses.  Craniotomy was then planned out just superior to the sinus.  Bur holes were created on top of the transverse sinus and connected with the craniotome.  Single piece temporal craniotomy was then elevated.  Hemostasis was easily secured on the epidural plane with bipolar electrocautery.  The craniotomy was then extended slightly more inferiorly using a combination of high-speed drill and Kerrison punches.  This allowed direct identification of the transverse sinus and transverse sigmoid junction.  At this point the microscope was draped sterilely and brought into the field and the remainder of the case was done under the microscope using microdissection technique.  Initially, the dura was opened with the 15 blade and dural opening was continued with the Metzenbaum scissors in a horseshoe fashion based inferiorly.  4-0 Nurolon tack up stitch was used.  Gentle elevation of the temporal lobe over the tentorium then immediately revealed a large draining vein from the posterior temporal region draining into the transverse sigmoid junction.  There was also a smaller draining vein from the temporal occipital region also draining into the transverse sigmoid junction slightly more proximally.  Dissection of arachnoid adhesions revealed the fistula to be completely obscured by the smaller  posterior draining vein.  Attempt was made to preserve this vein however while coagulating the fistula just medial to it, the draining vein was progressively stenosed until it appeared to be completely occluded.  I therefore coagulated the small posterior draining vein and divided it.  This allowed much better visualization of the more medial fistula.  At this point, the bipolar electrocautery was used to slowly coagulate the arterial feeders arising from the tentorium towards the inferior temporal occipital region.  The large venous varix was identified on the surface of the pia.  During coagulation of the more lateral portion of the fistula, I did encounter some arterial bleeding from the venous varix.  Further coagulation of the fistula to control this bleeding.  In this manner, the arterial feeders were coagulated from their emergence at the tentorium and divided.  As we worked in a lateral to medial direction, the fistula was slowly disconnected.  Finally, after all arterial feeders were disconnected, I identified more normal looking tentorium and normal pia medially.  I was then able to easily identify the 2 large cortically based draining veins seen on the preoperative angiogram.  These were also coagulated and divided.  At this point, the region was inspected and no active bleeding was identified.  I did not see any further arterialized supply emerging from the tentorium towards the pia.  The wound was then irrigated with a copious amount of normal saline.  No active bleeding was identified.  The brain surface was covered with a small layer of morselized Gelfoam with thrombin.  Dura was then reapproximated with interrupted 4 Nurolon stitches and covered with a DuraGen onlay graft.  The bone flap was then replaced and plated with standard titanium plates and screws.  The wound was then closed in multiple layers using a combination of interrupted 0 and 3-0 Vicryl stitches.  The skin was closed with  Dermabond.  At the end of the case all sponge, needle, instrument, and cottonoid counts were correct.  Patient was then removed from the [place]field head holder and he was extubated and taken to the postanesthesia care unit in stable hemodynamic condition.   Lisbeth Renshaw, MD [facility]

## 2021-03-14 NOTE — H&P (Signed)
Chief Complaint  Dural Fistula  History of Present Illness  Stephen Terry is a 56 y.o. male who initially presented several years ago with pulsatile right-sided tinnitus.  He was found to harbor a dural AV fistula and initially underwent endovascular embolization with complete resolution of his tinnitus.  He did undergo follow-up angiogram revealing some recanalization a few years ago.  He underwent follow-up angiogram more recently demonstrating continued recanalization.  Upon further review of the angiogram it did appear that his fistula is indeed high-grade representing a significant risk of hemorrhage.  After lengthy discussion of treatment options he did elect to proceed with surgical ligation of the fistula.  Past Medical History   Past Medical History:  Diagnosis Date   Anxiety    Bipolar disorder (HCC)    Depression    Hypertension    Stroke [facility])     Past Surgical History   Past Surgical History:  Procedure Laterality Date   IR ANGIO EXTERNAL CAROTID SEL EXT CAROTID BILAT MOD SED  01/03/2020   IR ANGIO EXTERNAL CAROTID SEL EXT CAROTID UNI L MOD SED  05/20/2019   IR ANGIO EXTERNAL CAROTID SEL EXT CAROTID UNI R MOD SED  05/20/2019   IR ANGIO EXTERNAL CAROTID SEL EXT CAROTID UNI R MOD SED  06/21/2019   IR ANGIO INTRA EXTRACRAN SEL INTERNAL CAROTID BILAT MOD SED  05/20/2019   IR ANGIO INTRA EXTRACRAN SEL INTERNAL CAROTID BILAT MOD SED  01/03/2020   IR ANGIO INTRA EXTRACRAN SEL INTERNAL CAROTID BILAT MOD SED  01/01/2021   IR ANGIO INTRA EXTRACRAN SEL INTERNAL CAROTID UNI R MOD SED  06/21/2019   IR ANGIO VERTEBRAL SEL VERTEBRAL BILAT MOD SED  05/20/2019   IR ANGIO VERTEBRAL SEL VERTEBRAL BILAT MOD SED  01/03/2020   IR ANGIO VERTEBRAL SEL VERTEBRAL BILAT MOD SED  01/01/2021   IR ANGIOGRAM FOLLOW UP STUDY  06/21/2019   IR NEURO EACH ADD'L AFTER BASIC UNI RIGHT (MS)  06/21/2019   IR NEURO EACH ADD'L AFTER BASIC UNI RIGHT (MS)  01/03/2020   IR TRANSCATH/EMBOLIZ  06/21/2019   LOOP RECORDER  INSERTION     RADIOLOGY WITH ANESTHESIA Right 06/21/2019   Procedure: 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;  Surgeon: Lisbeth Renshaw, MD;  Location: [facility] OR;  Service: Radiology;  Laterality: Right;   WISDOM TOOTH EXTRACTION      Social History   Social History   Tobacco Use   Smoking status: Heavy Smoker    Packs/day: 2.00    Types: Cigarettes   Smokeless tobacco: Never   Tobacco comments:    1.5-2 packs per day  Vaping Use   Vaping Use: Never used  Substance Use Topics   Alcohol use: Not Currently    Alcohol/week: 2.0 standard drinks    Types: 2 Cans of beer per week   Drug use: Yes    Types: Marijuana    Comment: occassional    Medications   Prior to Admission medications   Medication Sig Start Date End Date Taking? Authorizing Provider  aspirin 81 MG EC tablet Take 1 tablet (81 mg total) by mouth daily. 06/29/19  Yes Costella, Darci Current, PA-C  atorvastatin (LIPITOR) 80 MG tablet Take 1 tablet (80 mg total) by mouth daily at 6 PM. 02/22/19  Yes Biby, Jani Files, NP  fluticasone (FLONASE) 50 MCG/ACT nasal spray Place 1 spray into both nostrils daily as needed for allergies or rhinitis.   Yes [provider]  lamoTRIgine (LAMICTAL) 200 MG tablet Take  200 mg by mouth daily.   Yes [provider]  propranolol (INDERAL) 20 MG tablet Take 20-40 mg by mouth 3 (three) times daily as needed (For tremers).   Yes [provider]  sertraline (ZOLOFT) 100 MG tablet Take 100 mg by mouth daily.    Yes [provider]    Allergies  No Known Allergies  Review of Systems  ROS  Neurologic Exam  Awake, alert, oriented Memory and concentration grossly intact Speech fluent, appropriate CN grossly intact Motor exam: Upper Extremities Deltoid Bicep Tricep Grip  Right 5/5 5/5 5/5 5/5  Left 5/5 5/5 5/5 5/5   Lower Extremities IP Quad PF DF EHL  Right 5/5 5/5 5/5 5/5 5/5  Left 5/5 5/5 5/5 5/5 5/5   Sensation grossly intact to LT  Imaging   Diagnostic cerebral angiogram and CT angiogram were again personally reviewed and demonstrate the presence of a Cognard IV right-sided tentorial dural AV fistula with venous drainage into primarily to supratentorial cortical veins, 1 of which drains into the vein of Galen, the other into the distal segment of the superior sagittal sinus.  There is an associated venous varix at the site of fistula along the tentorium.  Impression  - 56 y.o. male with high-grade tentorial dural AV fistula  Plan  -We will plan on proceeding with right temporal occipital craniotomy for ligation of dural AV fistula  I have reviewed the details of the operation as well as the expected postoperative course and recovery with the patient.  We have also discussed at length the associated risks, benefits, and alternatives to surgery.  All his questions were answered and he provided informed consent to proceed today.   Lisbeth Renshaw, MD [facility]

## 2021-03-15 ENCOUNTER — Encounter (HOSPITAL_COMMUNITY): Payer: Self-pay | Admitting: Neurosurgery

## 2021-03-15 ENCOUNTER — Other Ambulatory Visit: Payer: Self-pay

## 2021-03-15 ENCOUNTER — Other Ambulatory Visit (HOSPITAL_COMMUNITY): Payer: Self-pay | Admitting: Pharmacist

## 2021-03-15 MED ORDER — LEVETIRACETAM 500 MG PO TABS: 500.0000 mg | ORAL_TABLET | Freq: Two times a day (BID) | ORAL | 2 refills | Status: AC

## 2021-03-15 MED ORDER — HYDROCODONE-ACETAMINOPHEN 5-325 MG PO TABS: 1.0000 | ORAL_TABLET | ORAL | 0 refills | Status: DC | PRN

## 2021-03-15 MED ORDER — ASPIRIN 81 MG PO TBEC: 81.0000 mg | DELAYED_RELEASE_TABLET | Freq: Every day | ORAL | 12 refills | Status: AC

## 2021-03-15 NOTE — Progress Notes (Signed)
°  Transition of Care [facility]) Screening Note   Patient Details  Name: Stephen Terry Date of Birth: [birth year]   Transition of Care [facility]) CM/SW Contact:    Mearl Latin, LCSW Phone Number: 03/15/2021, 10:09 AM    Transition of Care Department [facility]) has reviewed patient and no TOC needs have been identified at this time. We will continue to monitor patient advancement through interdisciplinary progression rounds. If new patient transition needs arise, please place a TOC consult.

## 2021-03-15 NOTE — Progress Notes (Signed)
Patient discharged home at this time. AVS reviewed and all questions answered. Patient wheeled downstairs to patient's father who drove him home. All IV's removed and ART line. Vital signs stable. See below.  Temp: 98.7  03/15/21 1500  Vitals  BP 133/68  MAP (mmHg) 86  Pulse Rate 67  ECG Heart Rate 67  Resp 18  Oxygen Therapy  SpO2 93 %  Art Line  Arterial Line BP 156/68  Arterial Line MAP (mmHg) 95 mmHg  MEWS Score  MEWS Temp 0  MEWS Systolic 0  MEWS Pulse 0  MEWS RR 0  MEWS LOC 0  MEWS Score 0  MEWS Score Color [place] Si

## 2021-03-15 NOTE — Discharge Summary (Signed)
°  Physician Discharge Summary  Patient ID: Stephen Terry MRN: [id number] DOB/AGE: [birth year] 54 y.o.  Admit date: 03/14/2021 Discharge date: 03/15/2021  Admission Diagnoses:  Right temporal Dural AV fistula  Discharge Diagnoses:  Same Principal Problem:   Brain aneurysm Active Problems:   Dural arteriovenous fistula   Discharged Condition: Stable  Hospital Course:  Stephen Terry is a 56 y.o. male admitted after right temporal craniotomy for ligation of a dural AVF. He was at baseline postop complaining of some headache but otherwise without new neurologic deficit. He was ambulating well, tolerating diet, voiding normally and requested d/c home.  Treatments: Surgery - right temporal crani for ligation of dural AVF  Discharge Exam: Blood pressure 133/68, pulse 67, temperature 97.9 F (36.6 C), temperature source Oral, resp. rate 18, SpO2 93 %. Awake, alert, oriented Speech fluent, appropriate CN grossly intact 5/5 BUE/BLE Wound c/d/i  Disposition: Discharge disposition: 01-Home or Self Care       Discharge Instructions     Call MD for:  redness, tenderness, or signs of infection (pain, swelling, redness, odor or green/yellow discharge around incision site)   Complete by: As directed    Call MD for:  temperature >100.4   Complete by: As directed    Diet - low sodium heart healthy   Complete by: As directed    Discharge instructions   Complete by: As directed    Walk at home as much as possible, at least 4 times / day   Increase activity slowly   Complete by: As directed    Lifting restrictions   Complete by: As directed    No lifting > 10 lbs   May shower / Bathe   Complete by: As directed    48 hours after surgery   May walk up steps   Complete by: As directed    No wound care   Complete by: As directed    Other Restrictions   Complete by: As directed    No bending/twisting at waist      Allergies as of 03/15/2021   No Known Allergies       Medication List     TAKE these medications    aspirin 81 MG EC tablet Take 1 tablet (81 mg total) by mouth daily. Start taking on: March 19, 2021 What changed: These instructions start on March 19, 2021. If you are unsure what to do until then, ask your doctor or other care provider.   atorvastatin 80 MG tablet Commonly known as: LIPITOR Take 1 tablet (80 mg total) by mouth daily at 6 PM.   fluticasone 50 MCG/ACT nasal spray Commonly known as: FLONASE Place 1 spray into both nostrils daily as needed for allergies or rhinitis.   HYDROcodone-acetaminophen 5-325 MG tablet Commonly known as: NORCO/VICODIN Take 1 tablet by mouth every 4 (four) hours as needed for moderate pain.   lamoTRIgine 200 MG tablet Commonly known as: LAMICTAL Take 200 mg by mouth daily.   levETIRAcetam 500 MG tablet Commonly known as: Keppra Take 1 tablet (500 mg total) by mouth 2 (two) times daily.   propranolol 20 MG tablet Commonly known as: INDERAL Take 20-40 mg by mouth 3 (three) times daily as needed (For tremers).   sertraline 100 MG tablet Commonly known as: ZOLOFT Take 100 mg by mouth daily.         SignedJackelyn Hoehn 03/15/2021, 4:56 PM

## 2021-03-15 NOTE — Progress Notes (Signed)
°  NEUROSURGERY PROGRESS NOTE   No issues overnight. Pt with HA. No new N/T/W.   EXAM:  BP (!) 152/77    Pulse 86    Temp 97.9 F (36.6 C) (Oral)    Resp 19    SpO2 96%   Awake, alert, oriented  Speech fluent, appropriate  CN grossly intact  5/5 BUE/BLE  Wound c/d/I, no leak  IMPRESSION:  56 y.o. male POD#1 right temporal crani for ligation of dAVF, doing well  PLAN: - d/c a-line - can d/c home today   Stephen Lose, MD [facility]

## 2021-03-25 ENCOUNTER — Ambulatory Visit (INDEPENDENT_AMBULATORY_CARE_PROVIDER_SITE_OTHER): Payer: PRIVATE HEALTH INSURANCE

## 2021-03-25 DIAGNOSIS — I639 Cerebral infarction, unspecified: Secondary | ICD-10-CM | POA: Diagnosis not present

## 2021-03-26 LAB — CUP PACEART REMOTE DEVICE CHECK
Date Time Interrogation Session: 20230303230807
Implantable Pulse Generator Implant Date: 20210507

## 2021-04-05 NOTE — Progress Notes (Signed)
Carelink Summary Report / Loop Recorder 

## 2021-04-29 ENCOUNTER — Ambulatory Visit (INDEPENDENT_AMBULATORY_CARE_PROVIDER_SITE_OTHER): Payer: PRIVATE HEALTH INSURANCE

## 2021-04-29 DIAGNOSIS — I639 Cerebral infarction, unspecified: Secondary | ICD-10-CM | POA: Diagnosis not present

## 2021-05-01 LAB — CUP PACEART REMOTE DEVICE CHECK
Date Time Interrogation Session: 20230405230625
Implantable Pulse Generator Implant Date: 20210507

## 2021-05-16 NOTE — Progress Notes (Signed)
Carelink Summary Report / Loop Recorder 

## 2021-05-22 LAB — COLOGUARD: COLOGUARD: NEGATIVE

## 2021-06-03 ENCOUNTER — Ambulatory Visit (INDEPENDENT_AMBULATORY_CARE_PROVIDER_SITE_OTHER): Payer: PRIVATE HEALTH INSURANCE

## 2021-06-03 DIAGNOSIS — I639 Cerebral infarction, unspecified: Secondary | ICD-10-CM | POA: Diagnosis not present

## 2021-06-05 LAB — CUP PACEART REMOTE DEVICE CHECK
Date Time Interrogation Session: 20230510231220
Implantable Pulse Generator Implant Date: 20210507

## 2021-06-24 NOTE — Progress Notes (Signed)
Carelink Summary Report / Loop Recorder 

## 2021-07-08 ENCOUNTER — Ambulatory Visit (INDEPENDENT_AMBULATORY_CARE_PROVIDER_SITE_OTHER): Payer: Medicaid Other

## 2021-07-08 DIAGNOSIS — I639 Cerebral infarction, unspecified: Secondary | ICD-10-CM

## 2021-07-10 LAB — CUP PACEART REMOTE DEVICE CHECK
Date Time Interrogation Session: 20230612230405
Implantable Pulse Generator Implant Date: 20210507

## 2021-07-30 NOTE — Progress Notes (Signed)
Carelink Summary Report / Loop Recorder 

## 2021-08-12 ENCOUNTER — Ambulatory Visit (INDEPENDENT_AMBULATORY_CARE_PROVIDER_SITE_OTHER): Payer: PRIVATE HEALTH INSURANCE

## 2021-08-12 DIAGNOSIS — I639 Cerebral infarction, unspecified: Secondary | ICD-10-CM | POA: Diagnosis not present

## 2021-08-13 LAB — CUP PACEART REMOTE DEVICE CHECK
Date Time Interrogation Session: 20230715230118
Implantable Pulse Generator Implant Date: 20210507

## 2021-09-16 ENCOUNTER — Ambulatory Visit (INDEPENDENT_AMBULATORY_CARE_PROVIDER_SITE_OTHER): Payer: PRIVATE HEALTH INSURANCE

## 2021-09-16 DIAGNOSIS — I639 Cerebral infarction, unspecified: Secondary | ICD-10-CM | POA: Diagnosis not present

## 2021-09-17 LAB — CUP PACEART REMOTE DEVICE CHECK
Date Time Interrogation Session: 20230817230309
Implantable Pulse Generator Implant Date: 20210507

## 2021-09-20 NOTE — Progress Notes (Signed)
Carelink Summary Report / Loop Recorder 

## 2021-10-10 NOTE — Progress Notes (Signed)
Carelink Summary Report / Loop Recorder 

## 2021-10-21 ENCOUNTER — Ambulatory Visit (INDEPENDENT_AMBULATORY_CARE_PROVIDER_SITE_OTHER): Payer: PRIVATE HEALTH INSURANCE

## 2021-10-21 DIAGNOSIS — I639 Cerebral infarction, unspecified: Secondary | ICD-10-CM | POA: Diagnosis not present

## 2021-10-22 LAB — CUP PACEART REMOTE DEVICE CHECK
Date Time Interrogation Session: 20231001230601
Implantable Pulse Generator Implant Date: 20210507

## 2021-11-06 NOTE — Progress Notes (Signed)
Carelink Summary Report / Loop Recorder 

## 2021-11-25 ENCOUNTER — Ambulatory Visit (INDEPENDENT_AMBULATORY_CARE_PROVIDER_SITE_OTHER): Payer: PRIVATE HEALTH INSURANCE

## 2021-11-25 DIAGNOSIS — I639 Cerebral infarction, unspecified: Secondary | ICD-10-CM

## 2021-11-27 LAB — CUP PACEART REMOTE DEVICE CHECK
Date Time Interrogation Session: 20231103230733
Implantable Pulse Generator Implant Date: 20210507

## 2021-12-30 ENCOUNTER — Ambulatory Visit (INDEPENDENT_AMBULATORY_CARE_PROVIDER_SITE_OTHER): Payer: PRIVATE HEALTH INSURANCE

## 2021-12-30 DIAGNOSIS — I639 Cerebral infarction, unspecified: Secondary | ICD-10-CM | POA: Diagnosis not present

## 2021-12-31 LAB — CUP PACEART REMOTE DEVICE CHECK
Date Time Interrogation Session: 20231210231109
Implantable Pulse Generator Implant Date: 20210507

## 2022-01-01 NOTE — Progress Notes (Signed)
Carelink Summary Report / Loop Recorder 

## 2022-02-03 ENCOUNTER — Ambulatory Visit (INDEPENDENT_AMBULATORY_CARE_PROVIDER_SITE_OTHER): Payer: PRIVATE HEALTH INSURANCE

## 2022-02-03 DIAGNOSIS — I639 Cerebral infarction, unspecified: Secondary | ICD-10-CM

## 2022-02-05 LAB — CUP PACEART REMOTE DEVICE CHECK
Date Time Interrogation Session: 20240112230850
Implantable Pulse Generator Implant Date: 20210507

## 2022-02-07 NOTE — Progress Notes (Signed)
Carelink Summary Report / Loop Recorder

## 2022-02-18 ENCOUNTER — Other Ambulatory Visit: Payer: Self-pay | Admitting: Neurosurgery

## 2022-02-18 DIAGNOSIS — I671 Cerebral aneurysm, nonruptured: Secondary | ICD-10-CM

## 2022-02-28 ENCOUNTER — Other Ambulatory Visit: Payer: Self-pay | Admitting: Neurosurgery

## 2022-03-09 LAB — CUP PACEART REMOTE DEVICE CHECK
Date Time Interrogation Session: 20240214230722
Implantable Pulse Generator Implant Date: 20210507

## 2022-03-10 ENCOUNTER — Ambulatory Visit (INDEPENDENT_AMBULATORY_CARE_PROVIDER_SITE_OTHER): Payer: PRIVATE HEALTH INSURANCE

## 2022-03-10 DIAGNOSIS — I639 Cerebral infarction, unspecified: Secondary | ICD-10-CM | POA: Diagnosis not present

## 2022-03-18 ENCOUNTER — Other Ambulatory Visit: Payer: Self-pay

## 2022-03-18 ENCOUNTER — Ambulatory Visit (HOSPITAL_COMMUNITY)
Admission: RE | Admit: 2022-03-18 | Discharge: 2022-03-18 | Disposition: A | Discharge status: Home / Self Care | Payer: PRIVATE HEALTH INSURANCE | Source: Ambulatory Visit | Attending: Neurosurgery | Admitting: Neurosurgery

## 2022-03-18 ENCOUNTER — Other Ambulatory Visit: Payer: Self-pay | Admitting: Neurosurgery

## 2022-03-18 DIAGNOSIS — I671 Cerebral aneurysm, nonruptured: Secondary | ICD-10-CM

## 2022-03-18 HISTORY — PX: IR ANGIO EXTERNAL CAROTID SEL EXT CAROTID BILAT MOD SED: IMG5372

## 2022-03-18 HISTORY — PX: IR ANGIO VERTEBRAL SEL VERTEBRAL BILAT MOD SED: IMG5369

## 2022-03-18 HISTORY — PX: IR ANGIO INTRA EXTRACRAN SEL INTERNAL CAROTID BILAT MOD SED: IMG5363

## 2022-03-18 LAB — CBC WITH DIFFERENTIAL/PLATELET
Abs Immature Granulocytes: 0.06 10*3/uL (ref 0.00–0.07)
Basophils Absolute: 0.1 10*3/uL (ref 0.0–0.1)
Basophils Relative: 0 %
Eosinophils Absolute: 0.7 10*3/uL — ABNORMAL HIGH (ref 0.0–0.5)
Eosinophils Relative: 5 %
HCT: 44.3 % (ref 39.0–52.0)
Hemoglobin: 15.5 g/dL (ref 13.0–17.0)
Immature Granulocytes: 1 %
Lymphocytes Relative: 15 %
Lymphs Abs: 2 10*3/uL (ref 0.7–4.0)
MCH: 32.7 pg (ref 26.0–34.0)
MCHC: 35 g/dL (ref 30.0–36.0)
MCV: 93.5 fL (ref 80.0–100.0)
Monocytes Absolute: 1 10*3/uL (ref 0.1–1.0)
Monocytes Relative: 8 %
Neutro Abs: 9.3 10*3/uL — ABNORMAL HIGH (ref 1.7–7.7)
Neutrophils Relative %: 71 %
Platelets: 176 10*3/uL (ref 150–400)
RBC: 4.74 MIL/uL (ref 4.22–5.81)
RDW: 13.5 % (ref 11.5–15.5)
WBC: 13.1 10*3/uL — ABNORMAL HIGH (ref 4.0–10.5)
nRBC: 0 % (ref 0.0–0.2)

## 2022-03-18 LAB — BASIC METABOLIC PANEL
Anion gap: 10 (ref 5–15)
BUN: 11 mg/dL (ref 6–20)
CO2: 24 mmol/L (ref 22–32)
Calcium: 8.7 mg/dL — ABNORMAL LOW (ref 8.9–10.3)
Chloride: 104 mmol/L (ref 98–111)
Creatinine, Ser: 0.87 mg/dL (ref 0.61–1.24)
GFR, Estimated: >60 mL/min (ref >60)
Glucose, Bld: 106 mg/dL — ABNORMAL HIGH (ref 70–99)
Potassium: 4.5 mmol/L (ref 3.5–5.1)
Sodium: 138 mmol/L (ref 135–145)

## 2022-03-18 LAB — PROTIME-INR
INR: 1 (ref 0.8–1.2)
Prothrombin Time: 12.9 s (ref 11.4–15.2)

## 2022-03-18 LAB — APTT: aPTT: 28 seconds (ref 24–36)

## 2022-03-18 MED ORDER — IOHEXOL 300 MG/ML  SOLN
100.0000 mL | Freq: Once | INTRAMUSCULAR | Status: AC | PRN
  Administered 2022-03-18: 45 mL via INTRA_ARTERIAL

## 2022-03-18 MED ORDER — CHLORHEXIDINE GLUCONATE CLOTH 2 % EX PADS: 6.0000 | MEDICATED_PAD | Freq: Once | CUTANEOUS | Status: DC

## 2022-03-18 MED ORDER — SODIUM CHLORIDE 0.9 % IV SOLN: INTRAVENOUS | Status: DC

## 2022-03-18 MED ORDER — FENTANYL CITRATE (PF) 100 MCG/2ML IJ SOLN
INTRAMUSCULAR | Status: AC | PRN
  Administered 2022-03-18: 25 ug via INTRAVENOUS

## 2022-03-18 MED ORDER — LIDOCAINE HCL 1 % IJ SOLN
INTRAMUSCULAR | Status: AC
  Administered 2022-03-18: 10 mL
  Filled 2022-03-18: qty 20

## 2022-03-18 MED ORDER — MIDAZOLAM HCL 2 MG/2ML IJ SOLN
INTRAMUSCULAR | Status: AC | PRN
  Administered 2022-03-18: 1 mg via INTRAVENOUS

## 2022-03-18 MED ORDER — MIDAZOLAM HCL 2 MG/2ML IJ SOLN
INTRAMUSCULAR | Status: AC
  Filled 2022-03-18: qty 2

## 2022-03-18 MED ORDER — FENTANYL CITRATE (PF) 100 MCG/2ML IJ SOLN
INTRAMUSCULAR | Status: AC
  Filled 2022-03-18: qty 2

## 2022-03-18 MED ORDER — CEFAZOLIN SODIUM-DEXTROSE 2-4 GM/100ML-% IV SOLN: 2.0000 g | INTRAVENOUS | Status: DC

## 2022-03-18 MED ORDER — HEPARIN SODIUM (PORCINE) 1000 UNIT/ML IJ SOLN
INTRAMUSCULAR | Status: AC | PRN
  Administered 2022-03-18: 2000 [IU] via INTRAVENOUS

## 2022-03-18 MED ORDER — IOHEXOL 300 MG/ML  SOLN: 100.0000 mL | Freq: Once | INTRAMUSCULAR | Status: DC | PRN

## 2022-03-18 MED ORDER — HEPARIN SODIUM (PORCINE) 1000 UNIT/ML IJ SOLN
INTRAMUSCULAR | Status: AC
  Filled 2022-03-18: qty 10

## 2022-03-18 MED ORDER — HYDROCODONE-ACETAMINOPHEN 5-325 MG PO TABS: 1.0000 | ORAL_TABLET | ORAL | Status: DC | PRN

## 2022-03-18 NOTE — H&P (Signed)
Chief Complaint   No chief complaint on file.   History of Present Illness  Stephen Terry is a 57 y.o. male with a history of pulsatile tinnitus and previous embolization. 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 He underwent surgical ligation approximately one year ago and has done well clinically. He presents today for routine follow-up angiogram.  Past Medical History   Past Medical History:  Diagnosis Date   Anxiety    Bipolar disorder ([place])    Depression    Hypertension    Stroke [facility])     Past Surgical History   Past Surgical History:  Procedure Laterality Date   APPLICATION OF CRANIAL NAVIGATION Right 03/14/2021   Procedure: APPLICATION OF CRANIAL NAVIGATION;  Surgeon: Consuella Lose, MD;  Location: [place];  Service: Neurosurgery;  Laterality: Right;   CRANIOTOMY Right 03/14/2021   Procedure: STEREOTACTIC RIGHT CRANIOTOMY FOR LIGATION OF DURAL FISTULA;  Surgeon: Consuella Lose, MD;  Location: [place];  Service: Neurosurgery;  Laterality: Right;   IR ANGIO EXTERNAL CAROTID SEL EXT CAROTID BILAT MOD SED  01/03/2020   IR ANGIO EXTERNAL CAROTID SEL EXT CAROTID UNI L MOD SED  05/20/2019   IR ANGIO EXTERNAL CAROTID SEL EXT CAROTID UNI R MOD SED  05/20/2019   IR ANGIO EXTERNAL CAROTID SEL EXT CAROTID UNI R MOD SED  06/21/2019   IR ANGIO INTRA EXTRACRAN SEL INTERNAL CAROTID BILAT MOD SED  05/20/2019   IR ANGIO INTRA EXTRACRAN SEL INTERNAL CAROTID BILAT MOD SED  01/03/2020   IR ANGIO INTRA EXTRACRAN SEL INTERNAL CAROTID BILAT MOD SED  01/01/2021   IR ANGIO INTRA EXTRACRAN SEL INTERNAL CAROTID UNI R MOD SED  06/21/2019   IR ANGIO VERTEBRAL SEL VERTEBRAL BILAT MOD SED  05/20/2019   IR ANGIO VERTEBRAL SEL VERTEBRAL BILAT MOD SED  01/03/2020   IR ANGIO VERTEBRAL SEL VERTEBRAL BILAT MOD SED  01/01/2021   IR ANGIOGRAM FOLLOW UP STUDY  06/21/2019   IR NEURO EACH ADD'L AFTER BASIC UNI RIGHT (MS)  06/21/2019   IR NEURO EACH ADD'L AFTER BASIC  UNI RIGHT (MS)  01/03/2020   IR TRANSCATH/EMBOLIZ  06/21/2019   LOOP RECORDER INSERTION     RADIOLOGY WITH ANESTHESIA Right 06/21/2019   Procedure: 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;  Surgeon: Consuella Lose, MD;  Location: [place];  Service: Radiology;  Laterality: Right;   WISDOM TOOTH EXTRACTION      Social History   Social History   Tobacco Use   Smoking status: Heavy Smoker    Packs/day: 2.00    Types: Cigarettes   Smokeless tobacco: Never   Tobacco comments:    1.5-2 packs per day  Vaping Use   Vaping Use: Never used  Substance Use Topics   Alcohol use: Not Currently    Alcohol/week: 2.0 standard drinks of alcohol    Types: 2 Cans of beer per week   Drug use: Yes    Types: Marijuana    Comment: occassional    Medications   Prior to Admission medications   Medication Sig Start Date End Date Taking? Authorizing Provider  aspirin 81 MG EC tablet Take 1 tablet (81 mg total) by mouth daily. 03/19/21   Consuella Lose, MD  atorvastatin (LIPITOR) 80 MG tablet Take 1 tablet (80 mg total) by mouth daily at 6 PM. 02/22/19   Donzetta Starch, NP  fluticasone (FLONASE) 50 MCG/ACT nasal spray Place 1 spray into both nostrils daily as needed for allergies  or rhinitis.    [provider]  lamoTRIgine (LAMICTAL) 200 MG tablet Take 200 mg by mouth daily.    [provider]  levETIRAcetam (KEPPRA) 500 MG tablet Take 1 tablet (500 mg total) by mouth 2 (two) times daily. 03/15/21   Consuella Lose, MD  propranolol (INDERAL) 20 MG tablet Take 20-40 mg by mouth 3 (three) times daily as needed (For tremers).    [provider]  sertraline (ZOLOFT) 100 MG tablet Take 100 mg by mouth daily.     [provider]    Allergies  No Known Allergies  Review of Systems  ROS  Neurologic Exam  Awake, alert, oriented Memory and concentration grossly intact Speech fluent, appropriate CN grossly intact Motor exam: Upper Extremities Deltoid Bicep  Tricep Grip  Right 5/5 5/5 5/5 5/5  Left 5/5 5/5 5/5 5/5   Lower Extremities IP Quad PF DF EHL  Right 5/5 5/5 5/5 5/5 5/5  Left 5/5 5/5 5/5 5/5 5/5   Sensation grossly intact to LT  Impression  - 57 y.o. male 1 yr s/p surgical ligation of a right temporal dAVF, doing well clinically  Plan  - Will proceed with f/u diagnostic cerebral angiogram  I have reviewed the indications for the procedure as well as the details of the procedure and the expected postoperative course and recovery at length with the patient in the office. We have also reviewed in detail the risks, benefits, and alternatives to the procedure. All questions were answered and [facility] provided informed consent to proceed.  Consuella Lose, MD [facility]

## 2022-03-18 NOTE — Brief Op Note (Signed)
  NEUROSURGERY BRIEF OPERATIVE  NOTE   PREOP DX: Fistula  POSTOP DX: Same  PROCEDURE: Diagnostic cerebral angiogram  SURGEON: Dr. Consuella Lose, MD  ANESTHESIA: IV Sedation with Local  APPROACH: Right trans-femoral  EBL: Minimal  SPECIMENS: None  COMPLICATIONS: None  CONDITION: Stable to recovery  FINDINGS (Full report in [facility]): 1. Normal cerebral angiogram without evidence of early venous drainage to suggest recanalization of the previously treated right transverse-sigmoid fistula.   Consuella Lose, MD [facility]

## 2022-03-26 NOTE — Progress Notes (Signed)
Carelink Summary Report / Loop Recorder 

## 2022-04-01 IMAGING — XA IR TRANSCATH EMBOLIZATION
7 of 14 series · 8 of 24 positions shown · IV contrast (IODINE)
Comparison: none

PROCEDURE:
ONYX EMBOLIZATION OF RIGHT TRANSVERSE-SIGMOID DURAL ARTERIOVENOUS
FISTULA
HISTORY: The patient is a 53-year-old man with a history of previous TIA,
workup including CT angiogram which demonstrated the likelihood of
dural AV fistula. He previously underwent diagnostic cerebral
angiogram confirming the presence of a right-sided transverse
sigmoid dural AV fistula likely fed from right external carotid
artery branches. After lengthy discussion of options, the patient
presents today for elective endovascular embolization of the right
external carotid artery feeders.
TECHNIQUE: CATHETERS AND WIRES
5-French STR Envoy Guide catheter

[Series 1: cerebral care 2 · 2 acquisitions, 1 frame shown (1 of 6)]
[im 1/2]
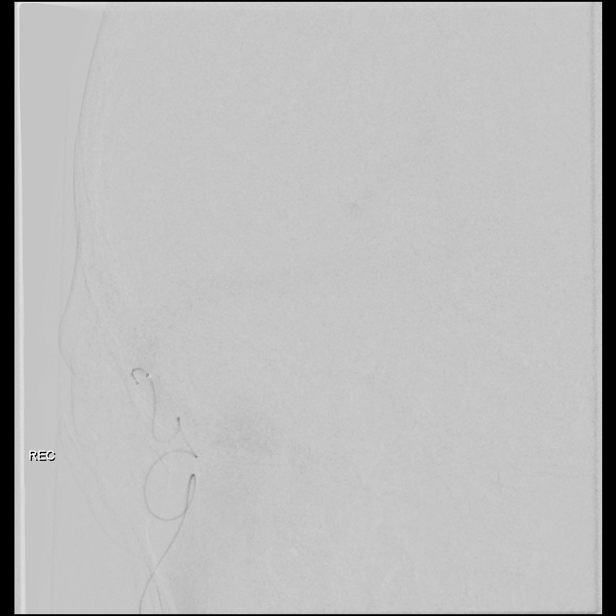

[Series 3: cerebral care 2 · 2 acquisitions, 1 frame shown (2 of 6)]
[im 1/2]
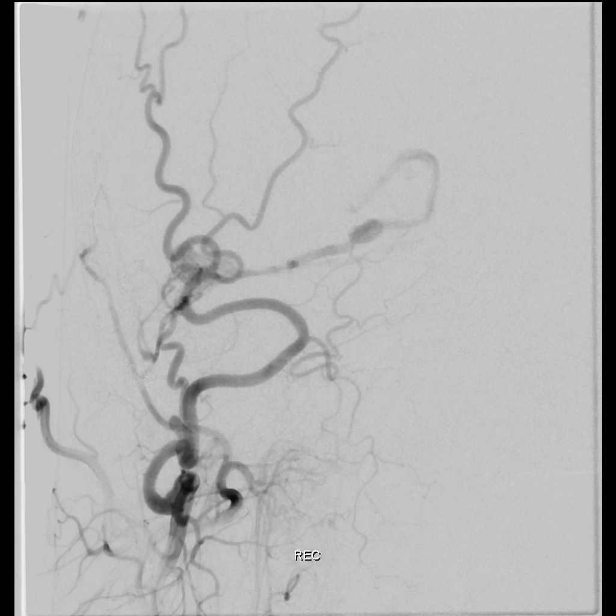

[Series 5: cerebral care 2 · 2 acquisitions, 1 frame shown (3 of 6)]
[im 1/2]
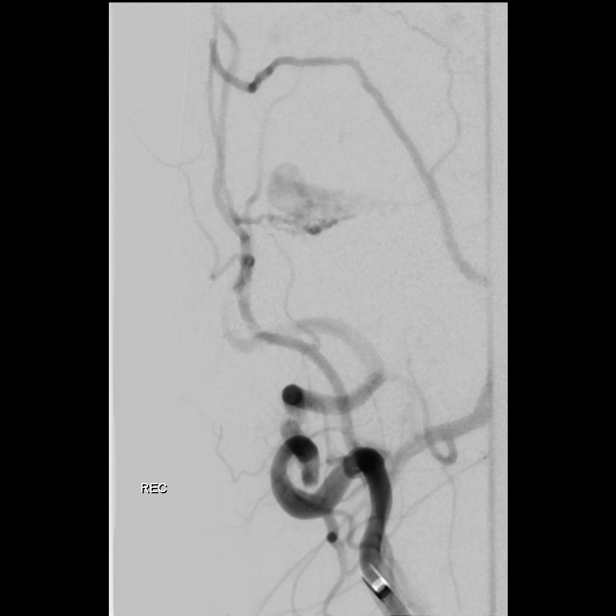

[Series 7: cerebral care 2 · 1 of 19 frames shown (4 of 6)]
[frame 14/19]
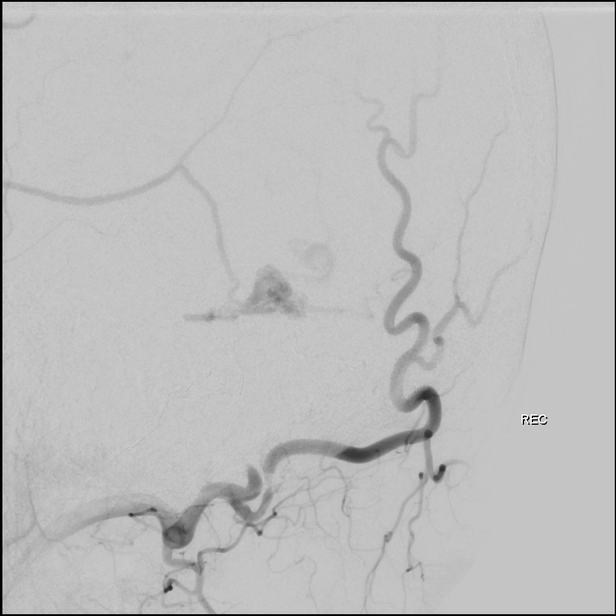

[Series 9: cerebral care 2 · 1 of 7 frames shown (5 of 6)]
[frame 4/7]
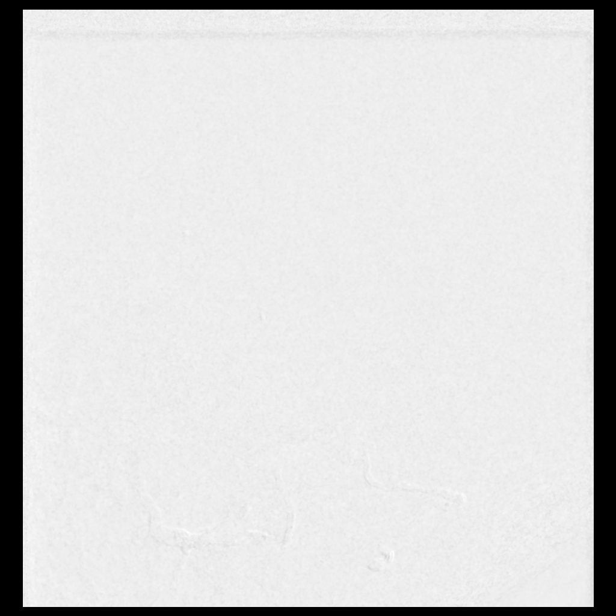

[Series 11: cerebral care 2 · 2 acquisitions, 1 frame shown (6 of 6)]
[im 1/2]
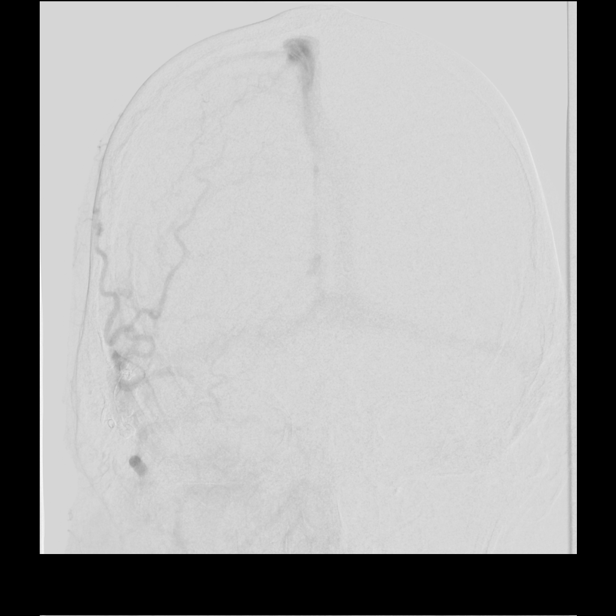

[Series 300: dr. (person_name) · 2 of 22 slices shown]
[im 1/22]
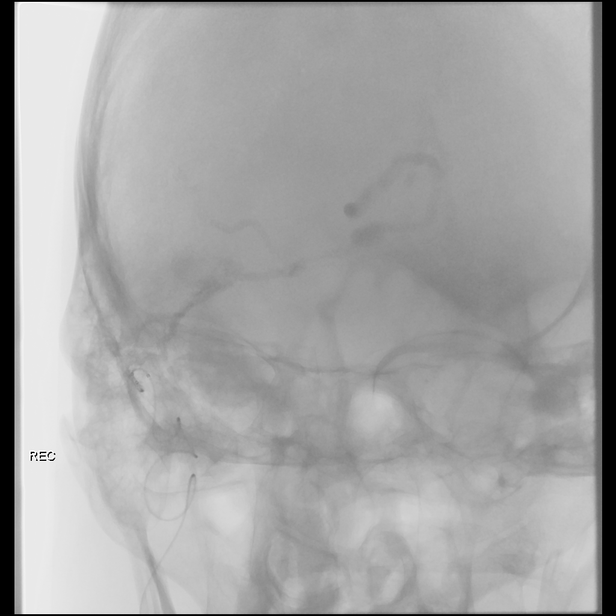
[im 15/22]
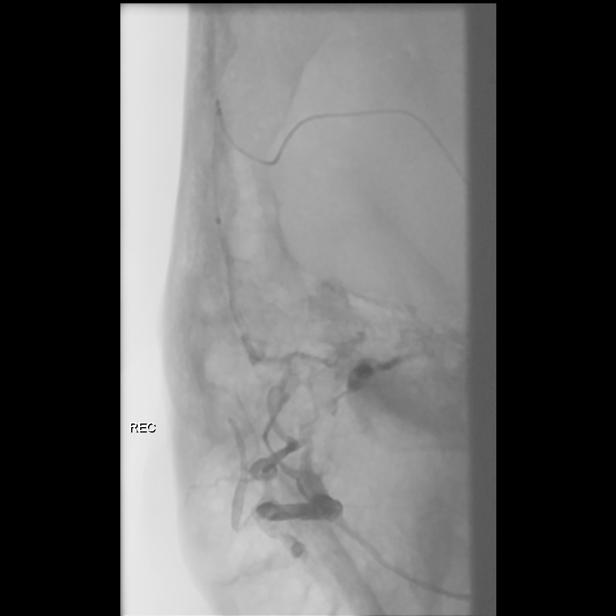

[8 of 24 positions shown; findings below may reference images not displayed]

ACCESS:
The technical aspects of the procedure as well as its potential
risks and benefits were reviewed with the patient's family. These
risks included but were not limited to stroke, intracranial
hemorrhage, bleeding, infection, allergic reaction, damage to organs
or vital structures, stroke, non-diagnostic procedure, and the
catastrophic outcomes of heart attack, coma, and death. With an
understanding of these risks, informed consent was obtained and
witnessed. The patient was placed in the supine position on the
angiography table and the skin of right groin prepped in the usual
sterile fashion. The procedure was performed under general
anesthesia. A 5-French sheath was introduced in the right common
femoral artery using Seldinger technique.

MEDICATIONS:
HEPARIN: 8000 Units total.

CONTRAST:  cc, Omnipaque 300

FLUOROSCOPY TIME:  FLUOROSCOPY TIME: See IR records
180 cm 0.035" glidewire

Phenom plus intermediate catheter

Sceptre XC 4 x 11mm balloon catheter

Maizvila microcatheter x2

Synchro select microwire x2

Synchro select soft microwire

Nur Muhammad Ghuri 010 microwire

Nur Muhammad Ghuri 008 microwire

Headliner 010 "J" tip microwire

EMBOLIC AGENT USED
Onyx 34

Onyx 18

VESSELS CATHETERIZED
Right occipital artery

Right accessory middle meningeal artery

Right external carotid artery

Right internal carotid artery

Right common femoral

VESSELS STUDIED
Right occipital artery, head (pre-embolization)

Right occipital artery, head (post embolization 1)

Right occipital artery, head (post embolization 2)

Right external carotid artery, head (post embolization 2)

Right accessory middle meningeal artery, head (pre embolization)

Right external carotid artery, head (final control)

Right internal carotid artery, head (final control)

Right common femoral

PROCEDURAL NARRATIVE
A 5-Fr straight Envoy guide catheter was advanced over a
glidewire into the aortic arch. The right external carotid artery
was then selected, and the guide catheter was advanced into the
right external carotid artery. The hypertrophic right occipital
artery was then selected and the guide catheter was advanced into
the proximal portion of the right occipital artery. Pre embolization
angiogram was taken.

Under roadmap guidance, multiple attempts were made to catheterize
with the sector catheter the superiorly directed branch from the
right occipital artery. Once good positioning of the balloon
catheter was achieved in a relatively straight segment of the right
occipital artery, this branch vessel was embolized with onyx 18
after the catheter was flushed with DMSO. This was done under blank
road map technique. Embolization was stopped when there was
significant amount of embolic reflux around the balloon portion of
the catheter. The last few aliquots of on ax were injected with the
balloon inflated. Once embolization was completed, the balloon was
deflated and the balloon catheter was removed without incident.
Angiogram was then taken from the guide catheter in the right
occipital artery. This demonstrated continued filling of the fistula
from a more distal occipital artery branch.

Multiple attempts were then made to introduce the Maizvila
microcatheter into this more distal branch which were unsuccessful
due to herniation of the catheter into the previously embolized
branch. I therefore elected to removed the 5 French envoy a and
introduced a 6 French envoy. The microcatheter was therefore
removed. Glidewire was reintroduced, and the 5 French sheath was
replaced for a 6 French sheath. The 6 French envoy a MPD was
introduced over the Glidewire. The right external carotid artery
followed by the proximal right occipital artery were again
catheterized.

The phi non plus intermediate catheter was then introduced over the
synchro 2 standard microwire. The Phenom Plus was then advanced into
the more distal occipital artery, just proximal to the branch of
interest. The Maizvila microcatheter was then again introduced over
the [place] wire. Because of significant tortuosity in this vessel I
was unable to advance the Maizvila microcatheter beyond the first
loop. Attempt was made to advance the Apollo catheter using the
headliner wire, also unsuccessful. I therefore introduced the [place]
008 microwire. This allowed more distal catheterization of the
occipital artery feeder. Once the microcatheter was adequately
positioned just proximal to the fistula, Onyx 18 was again used to
embolize this vessel in standard fashion after the catheter was
flushed with DMSO. This was done under blank road map technique.
Embolization was completed once there was significant reflux around
the tip of the microcatheter. The Maizvila microcatheter was then
removed without incident. I did note the distal end of the
microcatheter to have broken off during the pull.

Angiogram was again taken from the occipital artery noting
significant reflux into the parent external carotid. This
demonstrated continued filling of the fistula from a accessory
middle meningeal artery branch. The guide catheter was therefore
positioned in the main trunk of the right external carotid artery.
Under roadmap guidance, another Maizvila microcatheter was introduced
over the [place] 010 microwire and navigated into the posteriorly
directed branch of the accessory middle meningeal artery just
proximal to the fistula. In a similar fashion, onyx 34 was used to
embolize this branch after the catheter was flushed with DMSO.
Embolization completed when there was significant contrast reflux
around the tip of the catheter. The Apollo catheter was then removed
without incident. The tip was noted to be intact.

Angiogram was then taken from the right external carotid artery
without any further filling of the fistula seen. The guide catheter
was repositioned in the right internal carotid artery and angiogram
was taken. Again, no filling of the fistula or early venous drainage
was seen. The guide catheter was then removed without incident.
FINDINGS: Right occipital artery (pre embolization):

Angiogram taken from the microcatheter positioned in 1 of the right
occipital artery branches demonstrates significant filling of a
right transverse sigmoid dural AV fistula. There is a venous varix
noted just distal to the fistula. There is opacification of the
transverse and sigmoid sinuses, as well as faint opacification of
the straight sinus indicating likely cortical venous reflux and
emptying into the vein of Khoja as well.

Right occipital artery (post embolization #1):

Multiple angiograms taken after embolization of the proximal
superiorly directed branch demonstrates on ax cast within this
vessel and in a portion of the fistula. There is continued filling
of the right transverse sigmoid fistula with cortical venous reflux
and venous varix continues to be seen. This is arises primarily from
a step more distal superiorly directed branch of the right occipital
artery.

Right occipital artery (post embolization #2):

Multiple angiograms taken after embolization of the more distal
branch demonstrates occlusion of both the embolized vessels. Further
onyx cast is seen in these vessels and in a portion of the fistula.
There is continued filling of the dural AV fistula in the transverse
sigmoid region. There is also continued cortical venous reflux, now
clearly coursing medially and towards the vein of Khoja as well as
more posteriorly along the parietooccipital region. Fistula now
appears to be supplied primarily from the anterior direction. This
is better delineated on the dedicated external carotid artery
angiogram.

Right external carotid artery (post embolization #2):

External carotid artery injection demonstrates filling of the above
described fistula from the parietal branch of the accessory middle
meningeal artery.

Right accessory middle meningeal artery (pre-embolization):

Microcatheter injection taken from the accessory middle meningeal
artery demonstrates filling of the transverse sigmoid fistula with
similar venous drainage pattern. No supply to the brain parenchyma
is identified.

Right external carotid artery (final control):

Angiogram taken from the right external carotid artery again
demonstrates on ax cast in multiple branches of the right occipital
artery as well as the parietal branch of the accessory middle
meningeal artery. The remainder of the cranial branches of the
external carotid artery are unremarkable, with the exception of
increased transit time in the main trunk of the right occipital
artery. No further filling of the above described fistula is
identified. There is no early venous drainage identified.

Right internal carotid artery (final control):

Injection demonstrates widely patent right internal carotid artery
with normal bifurcation into anterior and middle cerebral arteries.
Distal branches of the ACA and MCA are unremarkable. No aneurysms
are identified. There is no supply to the above described fistula,
without any early venous drainage identified. Venous sinuses remain
widely patent, including the inferior sagittal sinus, vein of Khoja,
superior sagittal sinus, bilateral transverse and sigmoid sinuses.

Right femoral:

Normal vessel. No significant atherosclerotic disease. Arterial
sheath in adequate position.

DISPOSITION:
Upon completion of the study, the femoral sheath was removed and
hemostasis obtained using a 6-Fr ExoSeal closure device. Good
proximal and distal lower extremity pulses were documented upon
achievement of hemostasis. The procedure was well tolerated and no
early complications were observed.

The patient was transferred to the postanesthesia care unit in
stable neurologic and hemodynamic condition.
IMPRESSION: 1. Successful embolization of a right transverse sigmoid dural AV
fistula via embolization of 2 occipital artery feeders and accessory
middle meningeal artery features on the right side. No further
filling of the fistula is seen from the right external carotid or
internal carotid arteries.

The preliminary results of this procedure were shared with the
patient's family.

## 2022-04-14 ENCOUNTER — Ambulatory Visit (INDEPENDENT_AMBULATORY_CARE_PROVIDER_SITE_OTHER): Payer: PRIVATE HEALTH INSURANCE

## 2022-04-14 DIAGNOSIS — I639 Cerebral infarction, unspecified: Secondary | ICD-10-CM

## 2022-04-14 LAB — CUP PACEART REMOTE DEVICE CHECK
Date Time Interrogation Session: 20240324231009
Implantable Pulse Generator Implant Date: 20210507

## 2022-04-24 NOTE — Progress Notes (Signed)
Carelink Summary Report / Loop Recorder 

## 2022-05-19 ENCOUNTER — Ambulatory Visit (INDEPENDENT_AMBULATORY_CARE_PROVIDER_SITE_OTHER): Payer: PRIVATE HEALTH INSURANCE

## 2022-05-19 DIAGNOSIS — I639 Cerebral infarction, unspecified: Secondary | ICD-10-CM

## 2022-05-19 LAB — CUP PACEART REMOTE DEVICE CHECK
Date Time Interrogation Session: 20240426230152
Implantable Pulse Generator Implant Date: 20210507

## 2022-05-27 NOTE — Progress Notes (Signed)
Carelink Summary Report / Loop Recorder 

## 2022-06-20 NOTE — Progress Notes (Signed)
Carelink Summary Report / Loop Recorder 

## 2022-06-23 ENCOUNTER — Ambulatory Visit (INDEPENDENT_AMBULATORY_CARE_PROVIDER_SITE_OTHER): Payer: PRIVATE HEALTH INSURANCE

## 2022-06-23 DIAGNOSIS — I639 Cerebral infarction, unspecified: Secondary | ICD-10-CM | POA: Diagnosis not present

## 2022-06-23 LAB — CUP PACEART REMOTE DEVICE CHECK
Date Time Interrogation Session: 20240602230720
Implantable Pulse Generator Implant Date: 20210507

## 2022-07-15 NOTE — Progress Notes (Signed)
Carelink Summary Report / Loop Recorder 

## 2022-07-28 ENCOUNTER — Ambulatory Visit (INDEPENDENT_AMBULATORY_CARE_PROVIDER_SITE_OTHER): Payer: PRIVATE HEALTH INSURANCE

## 2022-07-28 DIAGNOSIS — I639 Cerebral infarction, unspecified: Secondary | ICD-10-CM | POA: Diagnosis not present

## 2022-07-28 LAB — CUP PACEART REMOTE DEVICE CHECK
Date Time Interrogation Session: 20240705230317
Implantable Pulse Generator Implant Date: 20210507

## 2022-08-14 NOTE — Progress Notes (Signed)
Carelink Summary Report / Loop Recorder 

## 2022-09-01 ENCOUNTER — Ambulatory Visit (INDEPENDENT_AMBULATORY_CARE_PROVIDER_SITE_OTHER): Payer: PRIVATE HEALTH INSURANCE

## 2022-09-01 DIAGNOSIS — I639 Cerebral infarction, unspecified: Secondary | ICD-10-CM

## 2022-09-16 NOTE — Progress Notes (Signed)
Carelink Summary Report / Loop Recorder 

## 2022-10-06 ENCOUNTER — Ambulatory Visit (INDEPENDENT_AMBULATORY_CARE_PROVIDER_SITE_OTHER): Payer: PRIVATE HEALTH INSURANCE

## 2022-10-06 DIAGNOSIS — I639 Cerebral infarction, unspecified: Secondary | ICD-10-CM | POA: Diagnosis not present

## 2022-10-07 LAB — CUP PACEART REMOTE DEVICE CHECK
Date Time Interrogation Session: 20240913230044
Implantable Pulse Generator Implant Date: 20210507

## 2022-10-22 NOTE — Progress Notes (Signed)
Carelink Summary Report / Loop Recorder 

## 2022-11-10 ENCOUNTER — Ambulatory Visit: Payer: PRIVATE HEALTH INSURANCE

## 2022-11-10 DIAGNOSIS — I639 Cerebral infarction, unspecified: Secondary | ICD-10-CM

## 2022-11-10 LAB — CUP PACEART REMOTE DEVICE CHECK
Date Time Interrogation Session: 20241020230154
Implantable Pulse Generator Implant Date: 20210507

## 2022-11-27 NOTE — Progress Notes (Signed)
Carelink Summary Report / Loop Recorder 

## 2022-12-15 ENCOUNTER — Ambulatory Visit (INDEPENDENT_AMBULATORY_CARE_PROVIDER_SITE_OTHER): Payer: PRIVATE HEALTH INSURANCE

## 2022-12-15 DIAGNOSIS — I639 Cerebral infarction, unspecified: Secondary | ICD-10-CM | POA: Diagnosis not present

## 2022-12-15 LAB — CUP PACEART REMOTE DEVICE CHECK
Date Time Interrogation Session: 20241122230133
Implantable Pulse Generator Implant Date: 20210507

## 2022-12-21 DEATH — deceased

## 2023-01-06 NOTE — Progress Notes (Signed)
Carelink Summary Report / Loop Recorder 

## 2023-01-19 ENCOUNTER — Ambulatory Visit (INDEPENDENT_AMBULATORY_CARE_PROVIDER_SITE_OTHER): Payer: PRIVATE HEALTH INSURANCE

## 2023-01-19 DIAGNOSIS — I639 Cerebral infarction, unspecified: Secondary | ICD-10-CM | POA: Diagnosis not present

## 2023-01-19 LAB — CUP PACEART REMOTE DEVICE CHECK
Date Time Interrogation Session: 20241229230354
Implantable Pulse Generator Implant Date: 20210507

## 2023-02-23 ENCOUNTER — Ambulatory Visit: Payer: PRIVATE HEALTH INSURANCE

## 2023-02-23 DIAGNOSIS — I639 Cerebral infarction, unspecified: Secondary | ICD-10-CM | POA: Diagnosis not present

## 2023-02-23 LAB — CUP PACEART REMOTE DEVICE CHECK
Date Time Interrogation Session: 20250202230259
Implantable Pulse Generator Implant Date: 20210507

## 2023-03-30 ENCOUNTER — Ambulatory Visit (INDEPENDENT_AMBULATORY_CARE_PROVIDER_SITE_OTHER): Payer: PRIVATE HEALTH INSURANCE

## 2023-03-30 DIAGNOSIS — I639 Cerebral infarction, unspecified: Secondary | ICD-10-CM

## 2023-03-30 LAB — CUP PACEART REMOTE DEVICE CHECK
Date Time Interrogation Session: 20250309230250
Implantable Pulse Generator Implant Date: 20210507

## 2023-04-01 ENCOUNTER — Encounter: Payer: Self-pay | Admitting: Internal Medicine

## 2023-04-01 NOTE — Progress Notes (Signed)
 Carelink Summary Report / Loop Recorder

## 2023-05-04 ENCOUNTER — Ambulatory Visit: Payer: PRIVATE HEALTH INSURANCE

## 2023-05-04 DIAGNOSIS — I639 Cerebral infarction, unspecified: Secondary | ICD-10-CM

## 2023-05-04 LAB — CUP PACEART REMOTE DEVICE CHECK
Date Time Interrogation Session: 20250413230231
Implantable Pulse Generator Implant Date: 20210507

## 2023-05-05 ENCOUNTER — Encounter: Payer: Self-pay | Admitting: Internal Medicine

## 2023-05-11 NOTE — Progress Notes (Signed)
 Carelink Summary Report / Loop Recorder

## 2023-06-08 ENCOUNTER — Ambulatory Visit (INDEPENDENT_AMBULATORY_CARE_PROVIDER_SITE_OTHER): Payer: PRIVATE HEALTH INSURANCE

## 2023-06-08 DIAGNOSIS — I639 Cerebral infarction, unspecified: Secondary | ICD-10-CM | POA: Diagnosis not present

## 2023-06-08 LAB — CUP PACEART REMOTE DEVICE CHECK
Date Time Interrogation Session: 20250518230647
Implantable Pulse Generator Implant Date: 20210507

## 2023-06-09 ENCOUNTER — Ambulatory Visit: Payer: Self-pay | Admitting: Internal Medicine

## 2023-06-24 NOTE — Progress Notes (Signed)
 Carelink Summary Report / Loop Recorder

## 2023-07-09 ENCOUNTER — Ambulatory Visit: Payer: Self-pay | Admitting: Internal Medicine

## 2023-07-09 ENCOUNTER — Ambulatory Visit (INDEPENDENT_AMBULATORY_CARE_PROVIDER_SITE_OTHER): Payer: PRIVATE HEALTH INSURANCE

## 2023-07-09 DIAGNOSIS — I639 Cerebral infarction, unspecified: Secondary | ICD-10-CM

## 2023-07-09 LAB — CUP PACEART REMOTE DEVICE CHECK
Date Time Interrogation Session: 20250618230316
Implantable Pulse Generator Implant Date: 20210507

## 2023-07-30 NOTE — Progress Notes (Signed)
 Carelink Summary Report / Loop Recorder

## 2023-08-10 ENCOUNTER — Ambulatory Visit: Payer: PRIVATE HEALTH INSURANCE

## 2023-08-10 DIAGNOSIS — I639 Cerebral infarction, unspecified: Secondary | ICD-10-CM

## 2023-08-11 LAB — CUP PACEART REMOTE DEVICE CHECK
Date Time Interrogation Session: 20250720230633
Implantable Pulse Generator Implant Date: 20210507

## 2023-08-12 ENCOUNTER — Ambulatory Visit: Payer: Self-pay | Admitting: Internal Medicine

## 2023-09-07 NOTE — Progress Notes (Signed)
 Carelink Summary Report / Loop Recorder

## 2023-09-10 ENCOUNTER — Ambulatory Visit (INDEPENDENT_AMBULATORY_CARE_PROVIDER_SITE_OTHER): Payer: PRIVATE HEALTH INSURANCE

## 2023-09-10 DIAGNOSIS — I639 Cerebral infarction, unspecified: Secondary | ICD-10-CM | POA: Diagnosis not present

## 2023-09-10 LAB — CUP PACEART REMOTE DEVICE CHECK
Date Time Interrogation Session: 20250820230633
Implantable Pulse Generator Implant Date: 20210507

## 2023-09-13 ENCOUNTER — Ambulatory Visit: Payer: Self-pay | Admitting: Internal Medicine

## 2023-10-12 ENCOUNTER — Ambulatory Visit (INDEPENDENT_AMBULATORY_CARE_PROVIDER_SITE_OTHER): Payer: PRIVATE HEALTH INSURANCE

## 2023-10-12 DIAGNOSIS — I639 Cerebral infarction, unspecified: Secondary | ICD-10-CM

## 2023-10-12 LAB — CUP PACEART REMOTE DEVICE CHECK
Date Time Interrogation Session: 20250921230446
Implantable Pulse Generator Implant Date: 20210507

## 2023-10-13 NOTE — Progress Notes (Signed)
 Remote Loop Recorder Transmission

## 2023-10-17 ENCOUNTER — Ambulatory Visit: Payer: Self-pay | Admitting: Internal Medicine

## 2023-10-26 NOTE — Progress Notes (Signed)
 Remote Loop Recorder Transmission

## 2023-11-13 ENCOUNTER — Ambulatory Visit: Payer: PRIVATE HEALTH INSURANCE | Attending: Internal Medicine

## 2023-11-13 DIAGNOSIS — I639 Cerebral infarction, unspecified: Secondary | ICD-10-CM

## 2023-11-13 LAB — CUP PACEART REMOTE DEVICE CHECK
Date Time Interrogation Session: 20251023230133
Implantable Pulse Generator Implant Date: 20210507

## 2023-11-18 NOTE — Progress Notes (Signed)
 Remote Loop Recorder Transmission

## 2023-11-19 ENCOUNTER — Ambulatory Visit: Payer: Self-pay | Admitting: Internal Medicine

## 2023-12-14 ENCOUNTER — Ambulatory Visit: Payer: PRIVATE HEALTH INSURANCE

## 2023-12-14 DIAGNOSIS — I639 Cerebral infarction, unspecified: Secondary | ICD-10-CM

## 2023-12-15 LAB — CUP PACEART REMOTE DEVICE CHECK
Date Time Interrogation Session: 20251123231234
Implantable Pulse Generator Implant Date: 20210507

## 2023-12-15 NOTE — Progress Notes (Signed)
 Remote Loop Recorder Transmission

## 2023-12-23 ENCOUNTER — Ambulatory Visit: Payer: Self-pay | Admitting: Internal Medicine

## 2024-01-14 ENCOUNTER — Ambulatory Visit: Payer: PRIVATE HEALTH INSURANCE

## 2024-01-14 DIAGNOSIS — I639 Cerebral infarction, unspecified: Secondary | ICD-10-CM

## 2024-01-14 LAB — CUP PACEART REMOTE DEVICE CHECK
Date Time Interrogation Session: 20251224231245
Implantable Pulse Generator Implant Date: 20210507

## 2024-01-15 NOTE — Progress Notes (Signed)
 Remote Loop Recorder Transmission

## 2024-01-17 ENCOUNTER — Ambulatory Visit: Payer: Self-pay | Admitting: Internal Medicine

## 2024-02-14 ENCOUNTER — Ambulatory Visit: Payer: PRIVATE HEALTH INSURANCE | Attending: Cardiovascular Disease

## 2024-02-14 DIAGNOSIS — I639 Cerebral infarction, unspecified: Secondary | ICD-10-CM | POA: Diagnosis not present

## 2024-02-15 LAB — CUP PACEART REMOTE DEVICE CHECK
Date Time Interrogation Session: 20260124230817
Implantable Pulse Generator Implant Date: 20210507

## 2024-02-16 ENCOUNTER — Ambulatory Visit: Payer: Self-pay | Admitting: Cardiovascular Disease

## 2024-02-18 NOTE — Progress Notes (Signed)
 Remote Loop Recorder Transmission

## 2024-03-16 ENCOUNTER — Ambulatory Visit: Payer: PRIVATE HEALTH INSURANCE

## 2024-04-16 ENCOUNTER — Ambulatory Visit: Payer: PRIVATE HEALTH INSURANCE

## 2024-05-17 ENCOUNTER — Ambulatory Visit: Payer: PRIVATE HEALTH INSURANCE

## 2024-06-17 ENCOUNTER — Ambulatory Visit: Payer: PRIVATE HEALTH INSURANCE
